# Patient Record
Sex: Male | Born: 1950
Health system: Southern US, Community
[De-identification: ages and names within clinical notes are randomized; demographics above are authoritative.]

## PROBLEM LIST (undated history)

## (undated) DIAGNOSIS — I10 Essential (primary) hypertension: Secondary | ICD-10-CM

## (undated) DIAGNOSIS — S0993XA Unspecified injury of face, initial encounter: Secondary | ICD-10-CM

## (undated) DIAGNOSIS — L409 Psoriasis, unspecified: Secondary | ICD-10-CM

## (undated) HISTORY — PX: NO PAST SURGERIES: SHX2092

## (undated) HISTORY — DX: Unspecified injury of face, initial encounter: S09.93XA

---

## 2007-12-06 ENCOUNTER — Ambulatory Visit: Payer: Self-pay | Admitting: Family Medicine

## 2007-12-06 DIAGNOSIS — I10 Essential (primary) hypertension: Secondary | ICD-10-CM

## 2007-12-11 ENCOUNTER — Encounter: Payer: Self-pay | Admitting: Family Medicine

## 2007-12-13 ENCOUNTER — Telehealth (INDEPENDENT_AMBULATORY_CARE_PROVIDER_SITE_OTHER): Payer: Self-pay | Admitting: *Deleted

## 2007-12-13 LAB — CONVERTED CEMR LAB
ALT: 26 units/L (ref 0–53)
AST: 22 units/L (ref 0–37)
Albumin: 5 g/dL (ref 3.5–5.2)
Basophils Absolute: 0.1 10*3/uL (ref 0.0–0.1)
Basophils Relative: 1 % (ref 0–1)
CO2: 27 meq/L (ref 19–32)
Calcium: 10 mg/dL (ref 8.4–10.5)
Chloride: 104 meq/L (ref 96–112)
Lymphocytes Relative: 33 % (ref 12–46)
MCHC: 33.7 g/dL (ref 30.0–36.0)
Neutro Abs: 3.8 10*3/uL (ref 1.7–7.7)
Neutrophils Relative %: 53 % (ref 43–77)
Potassium: 4.5 meq/L (ref 3.5–5.3)
RBC: 5.01 M/uL (ref 4.22–5.81)
RDW: 14.2 % (ref 11.5–15.5)
Total Protein: 7.4 g/dL (ref 6.0–8.3)

## 2008-02-27 LAB — HM COLONOSCOPY: HM Colonoscopy: NORMAL

## 2008-03-20 ENCOUNTER — Ambulatory Visit: Payer: Self-pay | Admitting: Family Medicine

## 2008-07-08 ENCOUNTER — Ambulatory Visit: Payer: Self-pay | Admitting: Family Medicine

## 2008-09-04 ENCOUNTER — Encounter: Payer: Self-pay | Admitting: Family Medicine

## 2008-09-05 LAB — CONVERTED CEMR LAB
CO2: 26 meq/L (ref 19–32)
Chloride: 105 meq/L (ref 96–112)
Glucose, Bld: 103 mg/dL — ABNORMAL HIGH (ref 70–99)
Potassium: 4.1 meq/L (ref 3.5–5.3)
Sodium: 141 meq/L (ref 135–145)

## 2008-09-09 ENCOUNTER — Ambulatory Visit: Payer: Self-pay | Admitting: Family Medicine

## 2009-01-07 ENCOUNTER — Ambulatory Visit: Payer: Self-pay | Admitting: Family Medicine

## 2009-01-10 ENCOUNTER — Encounter: Payer: Self-pay | Admitting: Family Medicine

## 2009-01-13 LAB — CONVERTED CEMR LAB
BUN: 13 mg/dL (ref 6–23)
CO2: 26 meq/L (ref 19–32)
Cholesterol: 183 mg/dL (ref 0–200)
Glucose, Bld: 106 mg/dL — ABNORMAL HIGH (ref 70–99)
Sodium: 142 meq/L (ref 135–145)
Total Bilirubin: 0.6 mg/dL (ref 0.3–1.2)
Total Protein: 7.2 g/dL (ref 6.0–8.3)
Triglycerides: 154 mg/dL — ABNORMAL HIGH (ref <150)
VLDL: 31 mg/dL (ref 0–40)

## 2009-05-30 ENCOUNTER — Ambulatory Visit: Payer: Self-pay | Admitting: Family Medicine

## 2009-05-30 DIAGNOSIS — R7301 Impaired fasting glucose: Secondary | ICD-10-CM

## 2009-09-23 ENCOUNTER — Telehealth: Payer: Self-pay | Admitting: Family Medicine

## 2009-10-11 ENCOUNTER — Encounter: Payer: Self-pay | Admitting: Family Medicine

## 2009-10-12 LAB — CONVERTED CEMR LAB
BUN: 15 mg/dL (ref 6–23)
Chloride: 106 meq/L (ref 96–112)
Glucose, Bld: 102 mg/dL — ABNORMAL HIGH (ref 70–99)
Potassium: 3.9 meq/L (ref 3.5–5.3)

## 2009-10-28 ENCOUNTER — Ambulatory Visit: Payer: Self-pay | Admitting: Family Medicine

## 2009-10-28 DIAGNOSIS — E781 Pure hyperglyceridemia: Secondary | ICD-10-CM

## 2010-04-28 NOTE — Assessment & Plan Note (Signed)
Summary: HTN, glucose   Vital Signs:  Patient profile:   60 year old male Height:      73 inches Weight:      192 pounds BMI:     25.42 Pulse rate:   86 / minute BP sitting:   154 / 93  (left arm) Cuff size:   regular  Vitals Entered By: Avon Gully CMA, Duncan Dull) (October 28, 2009 3:39 PM)  Serial Vital Signs/Assessments:  Time      Position  BP       Pulse  Resp  Temp     By 3:58 PM             142/92                         Nani Gasser MD  CC: f/u BP . Pt brought a form to be filled out for work, Hypertension Management   Primary Care Provider:  Nani Gasser MD  CC:  f/u BP . Pt brought a form to be filled out for work and Hypertension Management.  History of Present Illness: Also f/u elevated glucose.   Hypertension History:      He notes no problems with any antihypertensive medication side effects.  Home BPs have been in the 120/80s. We have measured his machine against ours and his machine is accurate.  Marland Kitchen        Positive major cardiovascular risk factors include male age 68 years old or older, hyperlipidemia, and hypertension.  Negative major cardiovascular risk factors include non-tobacco-user status.     Current Medications (verified): 1)  Diovan Hct 160-12.5 Mg  Tabs (Valsartan-Hydrochlorothiazide) .... One By Mouth Every Morning  Allergies (verified): No Known Drug Allergies  Comments:  Nurse/Medical Assistant: The patient's medications and allergies were reviewed with the patient and were updated in the Medication and Allergy Lists. Avon Gully CMA, Duncan Dull) (October 28, 2009 3:40 PM)  Physical Exam  General:  Well-developed,well-nourished,in no acute distress; alert,appropriate and cooperative throughout examination Lungs:  Normal respiratory effort, chest expands symmetrically. Lungs are clear to auscultation, no crackles or wheezes. Heart:  Normal rate and regular rhythm. S1 and S2 normal without gallop, murmur, click, rub or other  extra sounds. Pulses:  Radial 2+  Psych:  Cognition and judgment appear intact. Alert and cooperative with normal attention span and concentration. No apparent delusions, illusions, hallucinations   Impression & Recommendations:  Problem # 1:  HYPERTENSION (ICD-401.1) Home BPs ahve been excellent.  His updated medication list for this problem includes:    Diovan Hct 160-12.5 Mg Tabs (Valsartan-hydrochlorothiazide) ..... One by mouth every morning  BP today: 154/93 Prior BP: 152/89 (05/30/2009)  Prior 10 Yr Risk Heart Disease: 9 % (01/07/2009)  Labs Reviewed: K+: 3.9 (10/11/2009) Creat: : 1.00 (10/11/2009)   Chol: 183 (01/10/2009)   HDL: 38 (01/10/2009)   LDL: 114 (01/10/2009)   TG: 154 (01/10/2009)  Problem # 2:  IMPAIRED FASTING GLUCOSE (ICD-790.21) Cut back on his breads and soda. Has been exercising daily with a walking program. A1C looks great. Recheck in gluose in 6 months.   Orders: Fingerstick (36416) Hemoglobin A1C (83036)  Complete Medication List: 1)  Diovan Hct 160-12.5 Mg Tabs (Valsartan-hydrochlorothiazide) .... One by mouth every morning  Hypertension Assessment/Plan:      The patient's hypertensive risk group is category B: At least one risk factor (excluding diabetes) with no target organ damage.  His calculated 10 year risk of coronary heart  disease is 18 %.  Today's blood pressure is 154/93.    Patient Instructions: 1)  Please schedule a follow-up appointment in 6 months  for blood pressure and labs (lipids).   2)  keep up the exercise and diet.   Laboratory Results   Blood Tests   Date/Time Received: 10/28/09 Date/Time Reported: 10/28/09

## 2010-04-28 NOTE — Assessment & Plan Note (Signed)
Summary: 6 mo F/U HTN, glucose   Vital Signs:  Patient profile:   59 year old male Height:      73 inches Weight:      198 pounds Pulse rate:   76 / minute BP sitting:   152 / 89  (left arm) Cuff size:   regular  Vitals Entered By: Kathlene November (May 30, 2009 3:36 PM)  Serial Vital Signs/Assessments:  Time      Position  BP       Pulse  Resp  Temp     By 3:59 PM             148/90                         Nani Gasser MD  CC: follow-up BP   Primary Care Provider:  Nani Gasser MD  CC:  follow-up BP.  History of Present Illness: Has really been a very stressful week adn feels this is why BP is elevated. Took med this AM.  Highs at home 131/83.  On Average has been 125.  No changes in his diet. No CP, SOB, dizziness. No SE from medications. Happy with current pill.   Current Medications (verified): 1)  Diovan Hct 160-12.5 Mg  Tabs (Valsartan-Hydrochlorothiazide) .... One By Mouth Every Morning  Allergies (verified): No Known Drug Allergies  Comments:  Nurse/Medical Assistant: The patient's medications and allergies were reviewed with the patient and were updated in the Medication and Allergy Lists. Kathlene November (May 30, 2009 3:36 PM)  Physical Exam  General:  Well-developed,well-nourished,in no acute distress; alert,appropriate and cooperative throughout examination Head:  Normocephalic and atraumatic without obvious abnormalities. No apparent alopecia or balding. Lungs:  Normal respiratory effort, chest expands symmetrically. Lungs are clear to auscultation, no crackles or wheezes. Heart:  Normal rate and regular rhythm. S1 and S2 normal without gallop, murmur, click, rub or other extra sounds. No carotid bruits.  Skin:  no rashes.   Psych:  Cognition and judgment appear intact. Alert and cooperative with normal attention span and concentration. No apparent delusions, illusions, hallucinations   Impression & Recommendations:  Problem # 1:  HYPERTENSION  (ICD-401.1) Elevated today in the office but home BPs at home have been great. Will continue current regimen. F/u in the fall.   His updated medication list for this problem includes:    Diovan Hct 160-12.5 Mg Tabs (Valsartan-hydrochlorothiazide) ..... One by mouth every morning  BP today: 152/89 Prior BP: 128/74 (01/07/2009)  Prior 10 Yr Risk Heart Disease: 9 % (01/07/2009)  Labs Reviewed: K+: 4.1 (01/10/2009) Creat: : 0.87 (01/10/2009)   Chol: 183 (01/10/2009)   HDL: 38 (01/10/2009)   LDL: 114 (01/10/2009)   TG: 154 (01/10/2009)  Orders: T-Basic Metabolic Panel [phone])  Problem # 2:  IMPAIRED FASTING GLUCOSE (ICD-790.21) Due to recheck fasting since was elevated so need to recheck.   Complete Medication List: 1)  Diovan Hct 160-12.5 Mg Tabs (Valsartan-hydrochlorothiazide) .... One by mouth every morning  Other Orders: Tdap => 69yrs IM (01601) Admin 1st Vaccine (09323) Prescriptions: DIOVAN HCT 160-12.5 MG  TABS (VALSARTAN-HYDROCHLOROTHIAZIDE) one by mouth every morning  #90 x 2   Entered and Authorized by:   Nani Gasser MD   Signed by:   Nani Gasser MD on 05/30/2009   Method used:   Electronically to        [facility]* (retail)       [address]  [place], [place]  [address]       Ph: [phone]       Fax: [phone]   RxID:   [id number]   Flu Vaccine Result Date:  12/27/2008 Flu Vaccine Result:  given Flu Vaccine Next Due:  1 yr Flex Sig Next Due:  Not Indicated Hemoccult Next Due:  Not Indicated   Immunizations Administered:  Tetanus Vaccine:    Vaccine Type: Tdap    Site: left deltoid    Mfr: GlaxoSmithKline    Dose: 0.5 ml    Route: IM    Given by: Kathlene November    Exp. Date: 01/22/2010    Lot #: [id number]    VIS given: 02/14/07 version given May 30, 2009.

## 2010-04-28 NOTE — Progress Notes (Signed)
Summary: pt with a question   Phone Note Call from Patient   Caller: Patient Summary of Call: Call patient back at [phone]. He states that he left a message on your voice mail and has not heard back. He wants to know if he needs to make a appt to get his wellness form filled out for work or was him being seen here in April still sufficiant enough? Call patient back to let him know. Thanks, Michaelle Copas  September 23, 2009 11:08 AM  Initial call taken by: Michaelle Copas,  September 23, 2009 11:08 AM  Follow-up for Phone Call        Pt notified needs OV Follow-up by: Kathlene November,  September 23, 2009 12:50 PM

## 2010-06-02 ENCOUNTER — Ambulatory Visit: Payer: Self-pay | Admitting: Family Medicine

## 2010-11-09 ENCOUNTER — Encounter: Payer: Self-pay | Admitting: Family Medicine

## 2010-11-13 ENCOUNTER — Ambulatory Visit (INDEPENDENT_AMBULATORY_CARE_PROVIDER_SITE_OTHER): Payer: BC Managed Care – PPO | Admitting: Family Medicine

## 2010-11-13 ENCOUNTER — Encounter: Payer: Self-pay | Admitting: Family Medicine

## 2010-11-13 VITALS — BP 153/90 | HR 103 | Ht 73.0 in | Wt 197.0 lb

## 2010-11-13 DIAGNOSIS — I10 Essential (primary) hypertension: Secondary | ICD-10-CM

## 2010-11-13 NOTE — Progress Notes (Signed)
  Subjective:    Patient ID: Angel Flores, male    DOB: 1951/02/06, 60 y.o.   MRN: [id number]  HPI HTN - Home BPs have been 120-130/80s. He has checked his machine here and it compares appropirately. He took his medication this AM but has had a really stressful schedule. He has been traveling out of the country so hasn't been able to make his appt.  No CP or SOB. He feels really stressed today.  He just found out at work that he may have to travel to [place] again for several months    Review of Systems     Objective:   Physical Exam  Constitutional: He is oriented to person, place, and time. He appears well-developed and well-nourished.  HENT:  Head: Normocephalic and atraumatic.  Cardiovascular: Normal rate, regular rhythm and normal heart sounds.   Pulmonary/Chest: Effort normal and breath sounds normal.  Neurological: He is alert and oriented to person, place, and time.  Skin: Skin is warm and dry.  Psychiatric: He has a normal mood and affect.          Assessment & Plan:  He plns on getting his flu vac at work today.

## 2010-11-13 NOTE — Assessment & Plan Note (Signed)
Home BPs look great but it is high today.Asked him to come in next week for BP recheck and he is due for labwork.  Will recheck and call with results.

## 2010-11-13 NOTE — Patient Instructions (Signed)
Recommend BP check with the nurse sometime next week just to recheck.

## 2010-11-17 LAB — LIPID PANEL
Cholesterol: 184 mg/dL (ref 0–200)
HDL: 40 mg/dL (ref >39)
Triglycerides: 148 mg/dL (ref <150)

## 2010-11-18 ENCOUNTER — Telehealth: Payer: Self-pay | Admitting: Family Medicine

## 2010-11-18 LAB — COMPLETE METABOLIC PANEL WITH GFR
CO2: 26 mEq/L (ref 19–32)
Creat: 0.96 mg/dL (ref 0.50–1.35)
GFR, Est African American: >60 mL/min (ref >60)
GFR, Est Non African American: >60 mL/min (ref >60)
Glucose, Bld: 102 mg/dL — ABNORMAL HIGH (ref 70–99)
Total Bilirubin: 1 mg/dL (ref 0.3–1.2)

## 2010-11-18 NOTE — Telephone Encounter (Signed)
  Call pt: CMP is normal except sugar is still borderline but stable. TG look better this year. LDL is still up a little, but stable. Recheck BMP in 6months. Recheck chol in 1 yr.

## 2010-11-18 NOTE — Telephone Encounter (Signed)
LMOM with results

## 2010-11-23 ENCOUNTER — Telehealth: Payer: Self-pay | Admitting: Family Medicine

## 2010-11-23 NOTE — Telephone Encounter (Signed)
We will fax today. Sue Lush will call before she faxes it since he has a shared fax machine.

## 2010-11-23 NOTE — Telephone Encounter (Signed)
Pt called and said he needed a reminder sent to the provider that he needs to get the papers he dropped off on 11-13-10 faxed to him no later than Wednesday of this week which will be 11-25-10.  He has to turn in to his employer no later than end of this week.  Please advise.  The papers should of had a fax # that they are to be faxed to which will be going to the pt fax #. Plan:   Routed to Dr. Marlyne Beards, LPN Domingo Dimes

## 2010-11-23 NOTE — Telephone Encounter (Signed)
Notified the pt that his form will be faxed to him today.  Pt states he got the form faxed to him prior to lunch, but he needs it re-faxed with the office stamp faxed with it on the form. Plan:  Routed to [facility], CMA Jarvis Newcomer, LPN Domingo Dimes

## 2010-11-25 NOTE — Telephone Encounter (Signed)
Confirmed with Sue Lush, CMA that she had re-faxed the form with the office stamp on the form. Jarvis Newcomer, LPN Domingo Dimes

## 2011-05-26 ENCOUNTER — Other Ambulatory Visit: Payer: Self-pay | Admitting: Family Medicine

## 2011-05-27 NOTE — Telephone Encounter (Signed)
Needs appointment

## 2011-08-26 ENCOUNTER — Encounter: Payer: Self-pay | Admitting: *Deleted

## 2011-08-26 ENCOUNTER — Other Ambulatory Visit: Payer: Self-pay | Admitting: Family Medicine

## 2011-09-02 ENCOUNTER — Encounter: Payer: Self-pay | Admitting: Family Medicine

## 2011-09-02 ENCOUNTER — Ambulatory Visit (INDEPENDENT_AMBULATORY_CARE_PROVIDER_SITE_OTHER): Payer: BC Managed Care – PPO | Admitting: Family Medicine

## 2011-09-02 VITALS — BP 149/98 | HR 85 | Ht 73.0 in | Wt 194.0 lb

## 2011-09-02 DIAGNOSIS — I10 Essential (primary) hypertension: Secondary | ICD-10-CM

## 2011-09-02 MED ORDER — VALSARTAN-HYDROCHLOROTHIAZIDE 160-12.5 MG PO TABS: 1.0000 | ORAL_TABLET | Freq: Every day | ORAL | Status: DC

## 2011-09-02 NOTE — Progress Notes (Signed)
  Subjective:    Patient ID: Angel Flores, male    DOB: Mar 11, 1951, 61 y.o.   MRN: [id number]  HPI HTN- no CP or SOB.  Taking meds regularly. BPs have mostly been in the 130s at home.  Says does have white coat hypertension.    Psoriais flared while in [place].    Review of Systems     Objective:   Physical Exam  Constitutional: He is oriented to person, place, and time. He appears well-developed and well-nourished.  HENT:  Head: Normocephalic and atraumatic.  Cardiovascular: Normal rate, regular rhythm and normal heart sounds.   Pulmonary/Chest: Effort normal and breath sounds normal.  Musculoskeletal: He exhibits no edema.  Neurological: He is alert and oriented to person, place, and time.  Skin: Skin is warm and dry.  Psychiatric: He has a normal mood and affect. His behavior is normal.          Assessment & Plan:  HTN - BPs well controlled at home.  Due for CMP and lipids. He doe have white coat HTN. Since home BPs were well controlled will f/u in 6 months. We have verified his home BP machine and it is accurate.

## 2011-09-02 NOTE — Patient Instructions (Signed)
We will call you with your lab results. If you don't here from us in about a week then please give us a call at [phone].  

## 2011-09-08 LAB — COMPLETE METABOLIC PANEL WITH GFR
Albumin: 4.6 g/dL (ref 3.5–5.2)
CO2: 25 mEq/L (ref 19–32)
Calcium: 9.7 mg/dL (ref 8.4–10.5)
GFR, Est African American: >89 mL/min
GFR, Est Non African American: >89 mL/min
Glucose, Bld: 100 mg/dL — ABNORMAL HIGH (ref 70–99)
Sodium: 141 mEq/L (ref 135–145)
Total Bilirubin: 0.9 mg/dL (ref 0.3–1.2)
Total Protein: 7.1 g/dL (ref 6.0–8.3)

## 2011-09-08 LAB — LIPID PANEL: HDL: 44 mg/dL (ref >39)

## 2011-09-09 ENCOUNTER — Encounter: Payer: Self-pay | Admitting: Family Medicine

## 2012-06-16 ENCOUNTER — Other Ambulatory Visit: Payer: Self-pay | Admitting: Family Medicine

## 2012-07-15 ENCOUNTER — Other Ambulatory Visit: Payer: Self-pay | Admitting: Family Medicine

## 2012-07-19 ENCOUNTER — Other Ambulatory Visit: Payer: Self-pay | Admitting: *Deleted

## 2012-07-19 MED ORDER — VALSARTAN-HYDROCHLOROTHIAZIDE 160-12.5 MG PO TABS: ORAL_TABLET | ORAL | Status: DC

## 2012-08-07 ENCOUNTER — Ambulatory Visit (INDEPENDENT_AMBULATORY_CARE_PROVIDER_SITE_OTHER): Payer: BC Managed Care – PPO | Admitting: Family Medicine

## 2012-08-07 ENCOUNTER — Encounter: Payer: Self-pay | Admitting: Family Medicine

## 2012-08-07 VITALS — BP 174/93 | HR 73 | Ht 73.0 in | Wt 196.0 lb

## 2012-08-07 DIAGNOSIS — R7301 Impaired fasting glucose: Secondary | ICD-10-CM

## 2012-08-07 DIAGNOSIS — I10 Essential (primary) hypertension: Secondary | ICD-10-CM

## 2012-08-07 LAB — POCT GLYCOSYLATED HEMOGLOBIN (HGB A1C): Hemoglobin A1C: 5.4

## 2012-08-07 MED ORDER — VALSARTAN-HYDROCHLOROTHIAZIDE 160-12.5 MG PO TABS: ORAL_TABLET | ORAL | Status: DC

## 2012-08-07 NOTE — Progress Notes (Signed)
  Subjective:    Patient ID: Angel Flores, male    DOB: Nov 03, 1950, 62 y.o.   MRN: [id number]  HPI HTN- Home BPs running 120-low to mid 80s.  Pt denies chest pain, SOB, dizziness, or heart palpitations.  Taking meds as directed w/o problems.  Denies medication side effects.  BP was 122/81 today.  Trying to stay active.      Review of Systems     Objective:   Physical Exam  Constitutional: He is oriented to person, place, and time. He appears well-developed and well-nourished.  HENT:  Head: Normocephalic and atraumatic.  Cardiovascular: Normal rate, regular rhythm and normal heart sounds.   Pulmonary/Chest: Effort normal and breath sounds normal.  Neurological: He is alert and oriented to person, place, and time.  Skin: Skin is warm and dry.  Psychiatric: He has a normal mood and affect. His behavior is normal.          Assessment & Plan:  HTN - WEll controlled at home.  F/U in 6 months. Due for CMP and Lipids in 6 months.   Hyperlipidemia - Reviewed results from last June.  He would like to have it rechecked.   Lab Results  Component Value Date   CHOL 190 09/08/2011   HDL 44 09/08/2011   LDLCALC 116* 09/08/2011   TRIG 152* 09/08/2011   CHOLHDL 4.3 09/08/2011   Abnormal glucose-hemoglobin A1c is 5.4 today which is fantastic and reassuring that he is not diabetic or insulin resistant.

## 2012-08-08 LAB — COMPLETE METABOLIC PANEL WITH GFR
ALT: 29 U/L (ref 0–53)
Albumin: 4.4 g/dL (ref 3.5–5.2)
CO2: 27 mEq/L (ref 19–32)
Calcium: 9.4 mg/dL (ref 8.4–10.5)
Chloride: 106 mEq/L (ref 96–112)
GFR, Est African American: >89 mL/min
Potassium: 4.1 mEq/L (ref 3.5–5.3)
Sodium: 142 mEq/L (ref 135–145)
Total Protein: 6.5 g/dL (ref 6.0–8.3)

## 2012-08-08 LAB — LIPID PANEL: LDL Cholesterol: 107 mg/dL — ABNORMAL HIGH (ref 0–99)

## 2012-08-14 ENCOUNTER — Telehealth: Payer: Self-pay | Admitting: *Deleted

## 2012-08-14 NOTE — Telephone Encounter (Signed)
Pt calls today & wants to know if you have filled out the health questionaire/form that he left with you last week?  And if so, can you go ahead & fax it to the number he left you.  Thanks

## 2012-08-14 NOTE — Telephone Encounter (Signed)
Will send to [facility]

## 2012-08-15 NOTE — Telephone Encounter (Signed)
Called pt and informed him that his form has been faxed.Loralee Pacas [place]

## 2012-08-26 ENCOUNTER — Emergency Department (INDEPENDENT_AMBULATORY_CARE_PROVIDER_SITE_OTHER): Payer: BC Managed Care – PPO

## 2012-08-26 ENCOUNTER — Emergency Department
Admission: EM | Admit: 2012-08-26 | Discharge: 2012-08-26 | Disposition: A | Discharge status: Home / Self Care | Payer: BC Managed Care – PPO | Source: Home / Self Care | Attending: Family Medicine | Admitting: Family Medicine

## 2012-08-26 ENCOUNTER — Encounter: Payer: Self-pay | Admitting: *Deleted

## 2012-08-26 DIAGNOSIS — M7020 Olecranon bursitis, unspecified elbow: Secondary | ICD-10-CM

## 2012-08-26 DIAGNOSIS — A499 Bacterial infection, unspecified: Secondary | ICD-10-CM

## 2012-08-26 DIAGNOSIS — M25429 Effusion, unspecified elbow: Secondary | ICD-10-CM

## 2012-08-26 DIAGNOSIS — M71122 Other infective bursitis, left elbow: Secondary | ICD-10-CM

## 2012-08-26 HISTORY — DX: Psoriasis, unspecified: L40.9

## 2012-08-26 HISTORY — DX: Essential (primary) hypertension: I10

## 2012-08-26 LAB — POCT CBC W AUTO DIFF (K'VILLE URGENT CARE)

## 2012-08-26 MED ORDER — SULFAMETHOXAZOLE-TRIMETHOPRIM 800-160 MG PO TABS: 1.0000 | ORAL_TABLET | Freq: Two times a day (BID) | ORAL | Status: DC

## 2012-08-26 MED ORDER — CEFTRIAXONE SODIUM 1 G IJ SOLR
1.0000 g | Freq: Once | INTRAMUSCULAR | Status: AC
  Administered 2012-08-26: 1 g via INTRAMUSCULAR
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
## 2012-08-26 NOTE — ED Notes (Signed)
Patient c/o bump, swelling and pain on left elbow x 1 day.

## 2012-08-27 LAB — BODY FLUID CELL COUNT WITH DIFFERENTIAL
Eos, Fluid: 0 %
Lymphs, Fluid: 5 %
Monocyte-Macrophage-Serous Fluid: 5 %
Neutrophil Count, Fluid: 90 % — ABNORMAL HIGH (ref 0–25)

## 2012-08-28 ENCOUNTER — Ambulatory Visit (INDEPENDENT_AMBULATORY_CARE_PROVIDER_SITE_OTHER): Payer: BC Managed Care – PPO | Admitting: Sports Medicine

## 2012-08-28 ENCOUNTER — Encounter: Payer: Self-pay | Admitting: Sports Medicine

## 2012-08-28 VITALS — BP 156/92 | HR 89 | Wt 195.0 lb

## 2012-08-28 DIAGNOSIS — B9689 Other specified bacterial agents as the cause of diseases classified elsewhere: Secondary | ICD-10-CM

## 2012-08-28 DIAGNOSIS — M7020 Olecranon bursitis, unspecified elbow: Secondary | ICD-10-CM

## 2012-08-28 DIAGNOSIS — A499 Bacterial infection, unspecified: Secondary | ICD-10-CM

## 2012-08-28 DIAGNOSIS — M71122 Other infective bursitis, left elbow: Secondary | ICD-10-CM | POA: Insufficient documentation

## 2012-08-28 NOTE — Progress Notes (Signed)
   Subjective:    I'm seeing this patient as a consultation for:  Dr. Cathren Harsh  CC: Olecranon bursitis  HPI: This very pleasant 61 year old male has a two-day history of increasing pain, swelling, redness over his left elbow after bumping against something. He was seen in urgent care 2 days ago, 3 cc of cloudy fluid was aspirated, sent for culture, and he was started on Septra. Overall he reports the pain and swelling over his elbow has improved, he does have some increasing swelling somewhat distal down his forearm. Pain is only minimal, localized, without radiation. Denies any fevers or chills.  Past medical history, Surgical history, Family history not pertinant except as noted below, Social history, Allergies, and medications have been entered into the medical record, reviewed, and no changes needed.   Review of Systems: No headache, visual changes, nausea, vomiting, diarrhea, constipation, dizziness, abdominal pain, skin rash, fevers, chills, night sweats, weight loss, swollen lymph nodes, body aches, joint swelling, muscle aches, chest pain, shortness of breath, mood changes, visual or auditory hallucinations.   Objective:   General: Well Developed, well nourished, and in no acute distress.  Neuro/Psych: Alert and oriented x3, extra-ocular muscles intact, able to move all 4 extremities, sensation grossly intact. Skin: Warm and dry, no rashes noted.  Respiratory: Not using accessory muscles, speaking in full sentences, trachea midline.  Cardiovascular: Pulses palpable, no extremity edema. Abdomen: Does not appear distended. Left Elbow: Fullness visible as well as tenderness to palpation over her olecranon bursa, there is mild erythema visible. Range of motion full pronation, supination, flexion, extension. Strength is full to all of the above directions Stable to varus, valgus stress. Negative moving valgus stress test. No discrete areas of tenderness to palpation. Ulnar nerve does not  sublux. Negative cubital tunnel Tinel's.  Impression and Recommendations:   This case required medical decision making of moderate complexity.

## 2012-08-28 NOTE — Assessment & Plan Note (Signed)
Looks to be improved significantly since aspiration by Dr. Cathren Harsh 48 hours ago. Continue wrap, it was wrapped with compressive bandage. Continue Septra, he has an additional 8 days. Return in one week, and if no better certainly we can perform an incision and drainage, but overall it is improving significantly.

## 2012-08-29 LAB — CBC WITH DIFFERENTIAL/PLATELET
Basophils Absolute: 0 10*3/uL (ref 0.0–0.1)
Basophils Relative: 0 % (ref 0–1)
Eosinophils Absolute: 0.1 K/uL (ref 0.0–0.7)
Eosinophils Relative: 1 % (ref 0–5)
HCT: 39.3 % (ref 39.0–52.0)
Hemoglobin: 13.6 g/dL (ref 13.0–17.0)
Lymphocytes Relative: 18 % (ref 12–46)
Lymphs Abs: 1.8 K/uL (ref 0.7–4.0)
MCH: 31.3 pg (ref 26.0–34.0)
MCHC: 34.6 g/dL (ref 30.0–36.0)
MCV: 90.3 fL (ref 78.0–100.0)
Monocytes Absolute: 1.2 10*3/uL — ABNORMAL HIGH (ref 0.1–1.0)
Monocytes Relative: 12 % (ref 3–12)
Neutro Abs: 6.5 10*3/uL (ref 1.7–7.7)
Neutrophils Relative %: 69 % (ref 43–77)
Platelets: 224 10*3/uL (ref 150–400)
RBC: 4.35 MIL/uL (ref 4.22–5.81)
RDW: 13.8 % (ref 11.5–15.5)
WBC: 9.5 K/uL (ref 4.0–10.5)

## 2012-09-04 ENCOUNTER — Encounter: Payer: Self-pay | Admitting: Sports Medicine

## 2012-09-04 ENCOUNTER — Ambulatory Visit (INDEPENDENT_AMBULATORY_CARE_PROVIDER_SITE_OTHER): Payer: BC Managed Care – PPO | Admitting: Sports Medicine

## 2012-09-04 VITALS — BP 139/87 | HR 81 | Wt 192.0 lb

## 2012-09-04 DIAGNOSIS — B9689 Other specified bacterial agents as the cause of diseases classified elsewhere: Secondary | ICD-10-CM

## 2012-09-04 DIAGNOSIS — M71122 Other infective bursitis, left elbow: Secondary | ICD-10-CM

## 2012-09-04 DIAGNOSIS — A499 Bacterial infection, unspecified: Secondary | ICD-10-CM

## 2012-09-04 DIAGNOSIS — M7020 Olecranon bursitis, unspecified elbow: Secondary | ICD-10-CM

## 2012-09-04 MED ORDER — DOXYCYCLINE HYCLATE 100 MG PO TABS: 100.0000 mg | ORAL_TABLET | Freq: Two times a day (BID) | ORAL | Status: AC

## 2012-09-04 NOTE — Progress Notes (Signed)
   Subjective:    CC: Olecranon bursitis  HPI: This pleasant 62 year old male comes back for followup of his olecranon bursitis, septic. End up growing out Staphylococcus aureus. He has finished 7 days of Septra, and notes the pain is essentially completely resolved, swelling is localized, any pain that is there does not radiate, mild, sharp. Worse with palpation. No fevers, chills, or other constitutional symptoms.  Past medical history, Surgical history, Family history not pertinant except as noted below, Social history, Allergies, and medications have been entered into the medical record, reviewed, and no changes needed.   Review of Systems: No headache, visual changes, nausea, vomiting, diarrhea, constipation, dizziness, abdominal pain, skin rash, fevers, chills, night sweats, weight loss, swollen lymph nodes, body aches, joint swelling, muscle aches, chest pain, shortness of breath, mood changes, visual or auditory hallucinations.   Objective:   General: Well Developed, well nourished, and in no acute distress.  Neuro/Psych: Alert and oriented x3, extra-ocular muscles intact, able to move all 4 extremities, sensation grossly intact. Skin: Warm and dry, no rashes noted.  Respiratory: Not using accessory muscles, speaking in full sentences, trachea midline.  Cardiovascular: Pulses palpable, no extremity edema. Abdomen: Does not appear distended. Left Elbow: Fullness visible as well as tenderness to palpation over her olecranon bursa, there is no longer erythema, and this is improved significantly since the last visit. Range of motion full pronation, supination, flexion, extension. Strength is full to all of the above directions Stable to varus, valgus stress. Negative moving valgus stress test. No discrete areas of tenderness to palpation. Ulnar nerve does not sublux. Negative cubital tunnel Tinel's.  Impression and Recommendations:   This case required medical decision making of  moderate complexity.

## 2012-09-04 NOTE — Assessment & Plan Note (Signed)
Doing extremely well, painless. I'm going to do 5 more days of doxycycline. Strapped the elbow again. Return in one month.

## 2012-09-05 ENCOUNTER — Ambulatory Visit: Payer: BC Managed Care – PPO | Admitting: Sports Medicine

## 2012-10-02 ENCOUNTER — Encounter: Payer: Self-pay | Admitting: Sports Medicine

## 2012-10-02 ENCOUNTER — Ambulatory Visit (INDEPENDENT_AMBULATORY_CARE_PROVIDER_SITE_OTHER): Payer: BC Managed Care – PPO | Admitting: Sports Medicine

## 2012-10-02 VITALS — BP 136/88 | HR 79 | Temp 98.4°F | Wt 190.0 lb

## 2012-10-02 DIAGNOSIS — A499 Bacterial infection, unspecified: Secondary | ICD-10-CM

## 2012-10-02 DIAGNOSIS — M71122 Other infective bursitis, left elbow: Secondary | ICD-10-CM

## 2012-10-02 DIAGNOSIS — B9689 Other specified bacterial agents as the cause of diseases classified elsewhere: Secondary | ICD-10-CM

## 2012-10-02 DIAGNOSIS — M7020 Olecranon bursitis, unspecified elbow: Secondary | ICD-10-CM

## 2012-10-02 NOTE — Assessment & Plan Note (Signed)
Resolved with 2 courses of antibiotics, return on an as-needed basis.

## 2012-10-02 NOTE — Progress Notes (Signed)
  Subjective:    CC: Followup  HPI: Left septic olecranon bursitis: Resolved after 2 courses of antibiotics. Pain free.  Past medical history, Surgical history, Family history not pertinant except as noted below, Social history, Allergies, and medications have been entered into the medical record, reviewed, and no changes needed.   Review of Systems: No fevers, chills, night sweats, weight loss, chest pain, or shortness of breath.   Objective:    General: Well Developed, well nourished, and in no acute distress.  Neuro: Alert and oriented x3, extra-ocular muscles intact, sensation grossly intact.  HEENT: Normocephalic, atraumatic, pupils equal round reactive to light, neck supple, no masses, no lymphadenopathy, thyroid nonpalpable.  Skin: Warm and dry, no rashes. Cardiac: Regular rate and rhythm, no murmurs rubs or gallops, no lower extremity edema.  Respiratory: Clear to auscultation bilaterally. Not using accessory muscles, speaking in full sentences. Left Elbow: Unremarkable to inspection. Range of motion full pronation, supination, flexion, extension. Strength is full to all of the above directions Stable to varus, valgus stress. Negative moving valgus stress test. No discrete areas of tenderness to palpation. Ulnar nerve does not sublux. Negative cubital tunnel Tinel's.  Impression and Recommendations:

## 2013-05-14 ENCOUNTER — Other Ambulatory Visit: Payer: Self-pay | Admitting: Family Medicine

## 2013-06-12 ENCOUNTER — Encounter: Payer: Self-pay | Admitting: Family Medicine

## 2013-06-12 ENCOUNTER — Ambulatory Visit (INDEPENDENT_AMBULATORY_CARE_PROVIDER_SITE_OTHER): Payer: BC Managed Care – PPO | Admitting: Family Medicine

## 2013-06-12 VITALS — BP 154/88 | HR 78 | Wt 194.0 lb

## 2013-06-12 DIAGNOSIS — I10 Essential (primary) hypertension: Secondary | ICD-10-CM

## 2013-06-12 MED ORDER — VALSARTAN-HYDROCHLOROTHIAZIDE 160-12.5 MG PO TABS: ORAL_TABLET | ORAL | Status: DC

## 2013-06-12 NOTE — Progress Notes (Signed)
   Subjective:    Patient ID: FENIX RORKE, male    DOB: 05/22/1950, 63 y.o.   MRN: [id number]  HPI Hypertension- Pt denies chest pain, SOB, dizziness, or heart palpitations.  Taking meds as directed w/o problems.  Denies medication side effects.    . Home blood pressures have been running primarily in the 120s over 70s.   Review of Systems     Objective:   Physical Exam  Constitutional: He is oriented to person, place, and time. He appears well-developed and well-nourished.  HENT:  Head: Normocephalic and atraumatic.  Cardiovascular: Normal rate, regular rhythm and normal heart sounds.   Pulmonary/Chest: Effort normal and breath sounds normal.  Neurological: He is alert and oriented to person, place, and time.  Skin: Skin is warm and dry.  Psychiatric: He has a normal mood and affect. His behavior is normal.          Assessment & Plan:  Hypertension-slightly elevated today but well controlled at home. He was a little stressed today. We'll continue current regimen. Followup in 6 months. Due for CMP and fasting lipid panel in May. I want heparin lab slip today. I did add a hemoglobin A1c since he does have a history of impaired fasting glucose just to check this yearly.  Also encouraged him to think about additional vaccine. Handout provided.

## 2013-06-15 LAB — COMPLETE METABOLIC PANEL WITH GFR
ALT: 25 U/L (ref 0–53)
AST: 23 U/L (ref 0–37)
Albumin: 4.5 g/dL (ref 3.5–5.2)
Alkaline Phosphatase: 70 U/L (ref 39–117)
BILIRUBIN TOTAL: 1 mg/dL (ref 0.2–1.2)
BUN: 13 mg/dL (ref 6–23)
CALCIUM: 9.6 mg/dL (ref 8.4–10.5)
CHLORIDE: 104 meq/L (ref 96–112)
CO2: 28 meq/L (ref 19–32)
CREATININE: 0.98 mg/dL (ref 0.50–1.35)
GFR, EST NON AFRICAN AMERICAN: 83 mL/min
GLUCOSE: 101 mg/dL — AB (ref 70–99)
Potassium: 3.9 mEq/L (ref 3.5–5.3)
SODIUM: 142 meq/L (ref 135–145)
TOTAL PROTEIN: 7 g/dL (ref 6.0–8.3)

## 2013-06-15 LAB — LIPID PANEL
Cholesterol: 165 mg/dL (ref 0–200)
HDL: 39 mg/dL — AB (ref >39)
LDL CALC: 100 mg/dL — AB (ref 0–99)
Total CHOL/HDL Ratio: 4.2 Ratio
Triglycerides: 131 mg/dL (ref <150)
VLDL: 26 mg/dL (ref 0–40)

## 2013-06-15 LAB — HEMOGLOBIN A1C
HEMOGLOBIN A1C: 5.7 % — AB (ref <5.7)
Mean Plasma Glucose: 117 mg/dL — ABNORMAL HIGH (ref <117)

## 2013-09-03 ENCOUNTER — Telehealth: Payer: Self-pay | Admitting: *Deleted

## 2013-09-03 NOTE — Telephone Encounter (Signed)
lvm informing pt that form is complete and up front for p/u.Angel Flores

## 2014-06-27 ENCOUNTER — Other Ambulatory Visit: Payer: Self-pay | Admitting: Family Medicine

## 2014-08-04 ENCOUNTER — Other Ambulatory Visit: Payer: Self-pay | Admitting: Family Medicine

## 2014-08-12 ENCOUNTER — Telehealth: Payer: Self-pay | Admitting: Family Medicine

## 2014-08-12 ENCOUNTER — Other Ambulatory Visit: Payer: Self-pay | Admitting: *Deleted

## 2014-08-12 MED ORDER — VALSARTAN-HYDROCHLOROTHIAZIDE 160-12.5 MG PO TABS: 1.0000 | ORAL_TABLET | Freq: Every day | ORAL | Status: DC

## 2014-08-12 NOTE — Telephone Encounter (Signed)
Patient has an appt with you next week but is out of his medication Diovan HCT 160-12.5 mg. Please send to [facility] in [place].  thanks

## 2014-08-12 NOTE — Telephone Encounter (Signed)
Done

## 2014-08-20 ENCOUNTER — Ambulatory Visit (INDEPENDENT_AMBULATORY_CARE_PROVIDER_SITE_OTHER): Payer: BLUE CROSS/BLUE SHIELD | Admitting: Family Medicine

## 2014-08-20 ENCOUNTER — Encounter: Payer: Self-pay | Admitting: Family Medicine

## 2014-08-20 VITALS — BP 178/94 | HR 68 | Wt 199.0 lb

## 2014-08-20 DIAGNOSIS — Z114 Encounter for screening for human immunodeficiency virus [HIV]: Secondary | ICD-10-CM | POA: Diagnosis not present

## 2014-08-20 DIAGNOSIS — Z6379 Other stressful life events affecting family and household: Secondary | ICD-10-CM | POA: Diagnosis not present

## 2014-08-20 DIAGNOSIS — I10 Essential (primary) hypertension: Secondary | ICD-10-CM | POA: Diagnosis not present

## 2014-08-20 DIAGNOSIS — Z1159 Encounter for screening for other viral diseases: Secondary | ICD-10-CM | POA: Diagnosis not present

## 2014-08-20 MED ORDER — VALSARTAN-HYDROCHLOROTHIAZIDE 320-12.5 MG PO TABS: 1.0000 | ORAL_TABLET | Freq: Every day | ORAL | Status: DC

## 2014-08-20 NOTE — Progress Notes (Signed)
   Subjective:    Patient ID: Angel Flores, male    DOB: 03-01-1951, 64 y.o.   MRN: [id number]  HPI Hypertension- last seen in our office a year ago. Last labs in year ago as well. Pt denies chest pain, SOB, dizziness, or heart palpitations.  Taking meds as directed w/o problems.  Denies medication side effects.  Checks his BP at home but didn't bring in log. Has seen some BPs in the 140 recently.  Has quit exercising.   Has been fatigued for 9 months. Mother dx with dementia. He is under a lot of stress. Sleeping well overall. Had to change jobs at work to be in town more often for his mother. She is requiring 24 hour care.    Review of Systems     Objective:   Physical Exam  Constitutional: He is oriented to person, place, and time. He appears well-developed and well-nourished.  HENT:  Head: Normocephalic and atraumatic.  Cardiovascular: Normal rate, regular rhythm and normal heart sounds.   Pulmonary/Chest: Effort normal and breath sounds normal.  Neurological: He is alert and oriented to person, place, and time.  Skin: Skin is warm and dry.  Psychiatric: He has a normal mood and affect. His behavior is normal.          Assessment & Plan:  HTN -  Encourage him to bring in home BP log.  Will increase the Diovan component of his blood pressure medication. Follow-up in 6 months.  Stressed - Offered to refer him to counseling if he needs at any point.he thanked me for the offer but is not interested at this time.    he also has a form for work that needs to be completed for work, [employer] .

## 2014-08-24 LAB — LIPID PANEL
CHOLESTEROL: 178 mg/dL (ref 0–200)
HDL: 45 mg/dL (ref >=40)
LDL Cholesterol: 109 mg/dL — ABNORMAL HIGH (ref 0–99)
TRIGLYCERIDES: 119 mg/dL (ref <150)
Total CHOL/HDL Ratio: 4 Ratio
VLDL: 24 mg/dL (ref 0–40)

## 2014-08-24 LAB — COMPLETE METABOLIC PANEL WITH GFR
ALT: 30 U/L (ref 0–53)
AST: 28 U/L (ref 0–37)
Albumin: 4.8 g/dL (ref 3.5–5.2)
Alkaline Phosphatase: 68 U/L (ref 39–117)
BILIRUBIN TOTAL: 1 mg/dL (ref 0.2–1.2)
BUN: 12 mg/dL (ref 6–23)
CALCIUM: 9.6 mg/dL (ref 8.4–10.5)
CO2: 25 mEq/L (ref 19–32)
CREATININE: 0.83 mg/dL (ref 0.50–1.35)
Chloride: 105 mEq/L (ref 96–112)
GFR, Est Non African American: >89 mL/min
Glucose, Bld: 100 mg/dL — ABNORMAL HIGH (ref 70–99)
POTASSIUM: 4.1 meq/L (ref 3.5–5.3)
Sodium: 141 mEq/L (ref 135–145)
TOTAL PROTEIN: 7 g/dL (ref 6.0–8.3)

## 2014-08-24 LAB — HIV ANTIBODY (ROUTINE TESTING W REFLEX): HIV 1&2 Ab, 4th Generation: NONREACTIVE

## 2014-08-24 LAB — HEPATITIS C ANTIBODY: HCV Ab: NEGATIVE

## 2015-02-22 ENCOUNTER — Other Ambulatory Visit: Payer: Self-pay | Admitting: Family Medicine

## 2015-03-24 ENCOUNTER — Other Ambulatory Visit: Payer: Self-pay | Admitting: Family Medicine

## 2015-03-27 ENCOUNTER — Other Ambulatory Visit: Payer: Self-pay | Admitting: Family Medicine

## 2015-04-23 ENCOUNTER — Other Ambulatory Visit: Payer: Self-pay | Admitting: Family Medicine

## 2015-04-24 ENCOUNTER — Encounter: Payer: Self-pay | Admitting: Family Medicine

## 2015-04-24 ENCOUNTER — Ambulatory Visit (INDEPENDENT_AMBULATORY_CARE_PROVIDER_SITE_OTHER): Payer: BLUE CROSS/BLUE SHIELD | Admitting: Family Medicine

## 2015-04-24 VITALS — BP 156/89 | HR 68 | Ht 75.0 in | Wt 197.0 lb

## 2015-04-24 DIAGNOSIS — I10 Essential (primary) hypertension: Secondary | ICD-10-CM

## 2015-04-24 DIAGNOSIS — Z23 Encounter for immunization: Secondary | ICD-10-CM

## 2015-04-24 MED ORDER — VALSARTAN-HYDROCHLOROTHIAZIDE 320-12.5 MG PO TABS: 1.0000 | ORAL_TABLET | Freq: Every day | ORAL | Status: DC
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
## 2015-04-25 LAB — BASIC METABOLIC PANEL WITH GFR
BUN: 13 mg/dL (ref 7–25)
CALCIUM: 9.4 mg/dL (ref 8.6–10.3)
CO2: 29 mmol/L (ref 20–31)
Chloride: 103 mmol/L (ref 98–110)
Creat: 0.82 mg/dL (ref 0.70–1.25)
GLUCOSE: 77 mg/dL (ref 65–99)
POTASSIUM: 4 mmol/L (ref 3.5–5.3)
SODIUM: 142 mmol/L (ref 135–146)

## 2015-04-25 NOTE — Progress Notes (Signed)
Quick Note:  All labs are normal. ______ 

## 2015-10-01 ENCOUNTER — Ambulatory Visit (INDEPENDENT_AMBULATORY_CARE_PROVIDER_SITE_OTHER): Payer: BLUE CROSS/BLUE SHIELD | Admitting: Family Medicine

## 2015-10-01 ENCOUNTER — Encounter: Payer: Self-pay | Admitting: Family Medicine

## 2015-10-01 VITALS — BP 138/80 | HR 70 | Ht 75.0 in | Wt 196.0 lb

## 2015-10-01 DIAGNOSIS — I10 Essential (primary) hypertension: Secondary | ICD-10-CM

## 2015-10-01 DIAGNOSIS — E781 Pure hyperglyceridemia: Secondary | ICD-10-CM

## 2015-10-01 DIAGNOSIS — Z125 Encounter for screening for malignant neoplasm of prostate: Secondary | ICD-10-CM

## 2015-10-01 DIAGNOSIS — R7301 Impaired fasting glucose: Secondary | ICD-10-CM

## 2015-10-01 LAB — LIPID PANEL
CHOL/HDL RATIO: 3.4 ratio (ref <=5.0)
Cholesterol: 165 mg/dL (ref 125–200)
HDL: 49 mg/dL (ref >=40)
LDL CALC: 99 mg/dL (ref <130)
Triglycerides: 84 mg/dL (ref <150)
VLDL: 17 mg/dL (ref <30)

## 2015-10-01 LAB — COMPLETE METABOLIC PANEL WITH GFR
ALT: 26 U/L (ref 9–46)
AST: 28 U/L (ref 10–35)
Albumin: 4.7 g/dL (ref 3.6–5.1)
Alkaline Phosphatase: 65 U/L (ref 40–115)
BUN: 16 mg/dL (ref 7–25)
CO2: 28 mmol/L (ref 20–31)
Calcium: 9.6 mg/dL (ref 8.6–10.3)
Chloride: 105 mmol/L (ref 98–110)
Creat: 0.97 mg/dL (ref 0.70–1.25)
GFR, EST NON AFRICAN AMERICAN: 83 mL/min (ref >=60)
GFR, Est African American: >89 mL/min (ref >=60)
GLUCOSE: 89 mg/dL (ref 65–99)
POTASSIUM: 4.1 mmol/L (ref 3.5–5.3)
SODIUM: 141 mmol/L (ref 135–146)
TOTAL PROTEIN: 6.9 g/dL (ref 6.1–8.1)
Total Bilirubin: 0.9 mg/dL (ref 0.2–1.2)

## 2015-10-01 LAB — POCT GLYCOSYLATED HEMOGLOBIN (HGB A1C): HEMOGLOBIN A1C: 5.1

## 2015-10-01 MED ORDER — VALSARTAN-HYDROCHLOROTHIAZIDE 320-12.5 MG PO TABS: 1.0000 | ORAL_TABLET | Freq: Every day | ORAL | Status: DC

## 2015-10-01 NOTE — Progress Notes (Signed)
Subjective:    CC:   HPI: Hypertension- Pt denies chest pain, SOB, dizziness, or heart palpitations.  Taking meds as directed w/o problems.  Denies medication side effects.    IFG - No increased thirst or urination. Walking daily on his lunch break for 20-30 min   Past medical history, Surgical history, Family history not pertinant except as noted below, Social history, Allergies, and medications have been entered into the medical record, reviewed, and corrections made.   Review of Systems: No fevers, chills, night sweats, weight loss, chest pain, or shortness of breath.   Objective:    General: Well Developed, well nourished, and in no acute distress.  Neuro: Alert and oriented x3, extra-ocular muscles intact, sensation grossly intact.  HEENT: Normocephalic, atraumatic  Skin: Warm and dry, no rashes. Cardiac: Regular rate and rhythm, no murmurs rubs or gallops, no lower extremity edema.  Respiratory: Clear to auscultation bilaterally. Not using accessory muscles, speaking in full sentences.   Impression and Recommendations:    Hypertension- Well controlled. Continue current regimen. Follow up in 6 months. Keep up the exercise.   Impaired fasting glucose-hemoglobin A1c of 5.1 today which is fantastic. This is down from previous of 5.7. Great work. Recheck in one year.

## 2015-10-02 ENCOUNTER — Telehealth: Payer: Self-pay | Admitting: *Deleted

## 2015-10-02 LAB — PSA: PSA: 0.79 ng/mL (ref <=4.00)

## 2015-10-02 NOTE — Progress Notes (Signed)
Quick Note:  All labs are normal. ______ 

## 2015-10-02 NOTE — Telephone Encounter (Signed)
Form completed. Faxed, confirmation received, scanned. Pt informed and original placed up front for p/u.Audelia Hives [place]

## 2016-04-16 ENCOUNTER — Other Ambulatory Visit: Payer: Self-pay | Admitting: Family Medicine

## 2016-05-17 ENCOUNTER — Other Ambulatory Visit: Payer: Self-pay | Admitting: Family Medicine

## 2016-05-19 ENCOUNTER — Other Ambulatory Visit: Payer: Self-pay

## 2016-05-19 NOTE — Telephone Encounter (Signed)
Pt wants to know if he can get a refill on his valsartan-hydrochlorothiazide till his next follow-up appt on 06/10/16? Pt's last follow-up for hypertension was on 04/24/15. Pt states he does not want to come in sooner due to it being peak flu season.

## 2016-05-20 MED ORDER — VALSARTAN-HYDROCHLOROTHIAZIDE 320-12.5 MG PO TABS: 1.0000 | ORAL_TABLET | Freq: Every day | ORAL | 0 refills | Status: DC

## 2016-05-20 NOTE — Telephone Encounter (Signed)
Pt informed Rx was sent to pharmacy.

## 2016-06-10 ENCOUNTER — Encounter: Payer: Self-pay | Admitting: Family Medicine

## 2016-06-10 ENCOUNTER — Ambulatory Visit (INDEPENDENT_AMBULATORY_CARE_PROVIDER_SITE_OTHER): Payer: BLUE CROSS/BLUE SHIELD | Admitting: Family Medicine

## 2016-06-10 DIAGNOSIS — R7301 Impaired fasting glucose: Secondary | ICD-10-CM | POA: Diagnosis not present

## 2016-06-10 DIAGNOSIS — I10 Essential (primary) hypertension: Secondary | ICD-10-CM | POA: Diagnosis not present

## 2016-06-10 LAB — POCT GLYCOSYLATED HEMOGLOBIN (HGB A1C): Hemoglobin A1C: 5.1

## 2016-06-10 MED ORDER — VALSARTAN-HYDROCHLOROTHIAZIDE 320-12.5 MG PO TABS: 1.0000 | ORAL_TABLET | Freq: Every day | ORAL | 1 refills | Status: DC

## 2016-06-10 NOTE — Progress Notes (Signed)
Subjective:    CC: HTN  HPI: Hypertension- Pt denies chest pain, SOB, dizziness, or heart palpitations.  Taking meds as directed w/o problems.  Denies medication side effects.  He checks his blood pressures at home and says that been very well controlled. His new pill seems to be working more consistently for him and he's tolerated it well.  Impaired fasting glucose-no increased thirst or urination. No symptoms consistent with hypoglycemia. Lab Results  Component Value Date   HGBA1C 5.1 06/10/2016      Past medical history, Surgical history, Family history not pertinant except as noted below, Social history, Allergies, and medications have been entered into the medical record, reviewed, and corrections made.   Review of Systems: No fevers, chills, night sweats, weight loss, chest pain, or shortness of breath.   Objective:    General: Well Developed, well nourished, and in no acute distress.  Neuro: Alert and oriented x3, extra-ocular muscles intact, sensation grossly intact.  HEENT: Normocephalic, atraumatic  Skin: Warm and dry, no rashes. Cardiac: Regular rate and rhythm, no murmurs rubs or gallops, no lower extremity edema.  Respiratory: Clear to auscultation bilaterally. Not using accessory muscles, speaking in full sentences.   Impression and Recommendations:   HTN - Repeat blood pressure borderline today he does report good home blood pressures. Encouraged him to follow-up in a few weeks to recheck blood pressures with a nurse visit. Otherwise I will see him in 6 months.  Due for BMP.   IFG - A1c 5.1 today which looks fantastic and still in the normal range. Continue to monitor every 6-12 months.

## 2016-06-10 NOTE — Patient Instructions (Signed)
Come back in 2 weeks for bp check with nurse

## 2016-06-11 LAB — BASIC METABOLIC PANEL
BUN: 12 mg/dL (ref 7–25)
CHLORIDE: 104 mmol/L (ref 98–110)
CO2: 24 mmol/L (ref 20–31)
Calcium: 9.5 mg/dL (ref 8.6–10.3)
Creat: 0.98 mg/dL (ref 0.70–1.25)
Glucose, Bld: 95 mg/dL (ref 65–99)
POTASSIUM: 4 mmol/L (ref 3.5–5.3)
SODIUM: 142 mmol/L (ref 135–146)

## 2016-06-11 NOTE — Progress Notes (Signed)
All labs are normal. 

## 2016-06-23 ENCOUNTER — Ambulatory Visit: Payer: BLUE CROSS/BLUE SHIELD

## 2016-06-24 ENCOUNTER — Ambulatory Visit: Payer: BLUE CROSS/BLUE SHIELD

## 2016-11-11 ENCOUNTER — Telehealth: Payer: Self-pay | Admitting: Family Medicine

## 2016-11-11 NOTE — Telephone Encounter (Addendum)
Pt called. He states he dropped off Biometric Screening form last week and so far he has not heard anything from [place], form is due by midnight on 8/17.  Thank you.

## 2016-11-12 NOTE — Telephone Encounter (Signed)
Form faxed, confirmation received, copied, and scanned .Angel Flores

## 2016-11-12 NOTE — Telephone Encounter (Signed)
Spoke w/pt and informed him that his form has been completed with the exception of the cholesterol. This reading is over 74yr old and cannot be used. I advised pt that I can fax his form, he is ok with this. Maryruth Eve, L[place] Crocker

## 2016-11-16 ENCOUNTER — Ambulatory Visit: Payer: BLUE CROSS/BLUE SHIELD | Admitting: Family Medicine

## 2016-12-09 ENCOUNTER — Encounter: Payer: Self-pay | Admitting: Family Medicine

## 2016-12-09 ENCOUNTER — Ambulatory Visit (INDEPENDENT_AMBULATORY_CARE_PROVIDER_SITE_OTHER): Payer: BLUE CROSS/BLUE SHIELD | Admitting: Family Medicine

## 2016-12-09 ENCOUNTER — Other Ambulatory Visit: Payer: Self-pay | Admitting: Family Medicine

## 2016-12-09 VITALS — BP 140/78 | HR 77 | Ht 75.0 in | Wt 197.0 lb

## 2016-12-09 DIAGNOSIS — I10 Essential (primary) hypertension: Secondary | ICD-10-CM | POA: Diagnosis not present

## 2016-12-09 DIAGNOSIS — R7301 Impaired fasting glucose: Secondary | ICD-10-CM

## 2016-12-09 DIAGNOSIS — Z23 Encounter for immunization: Secondary | ICD-10-CM | POA: Diagnosis not present

## 2016-12-09 LAB — POCT GLYCOSYLATED HEMOGLOBIN (HGB A1C): Hemoglobin A1C: 5.2

## 2016-12-09 MED ORDER — VALSARTAN-HYDROCHLOROTHIAZIDE 320-12.5 MG PO TABS: 1.0000 | ORAL_TABLET | Freq: Every day | ORAL | 1 refills | Status: DC

## 2016-12-09 NOTE — Progress Notes (Signed)
   Subjective:    Patient ID: Angel Flores, male    DOB: [birth year], 66 y.o.   MRN: [id number]  HPI Hypertension- Pt denies chest pain, SOB, dizziness, or heart palpitations.  Taking meds as directed w/o problems.  Denies medication side effects.    Impaired fasting glucose-no increased thirst or urination. No symptoms consistent with hypoglycemia.   Review of Systems     Objective:   Physical Exam  Constitutional: He is oriented to person, place, and time. He appears well-developed and well-nourished.  HENT:  Head: Normocephalic and atraumatic.  Cardiovascular: Normal rate, regular rhythm and normal heart sounds.   Pulmonary/Chest: Effort normal and breath sounds normal.  Neurological: He is alert and oriented to person, place, and time.  Skin: Skin is warm and dry.  Psychiatric: He has a normal mood and affect. His behavior is normal.        Assessment & Plan:  HTN - blood pressure borderline we'll continue to monitor. Continue current regimen. Follow-up in 6 months.  IFG - 11 A1c looks fantastic today. Continue to monitor. Lab Results  Component Value Date   HGBA1C 5.2 12/09/2016     Prevnar 13 today.   Declined flu vaccine.

## 2016-12-14 DIAGNOSIS — E781 Pure hyperglyceridemia: Secondary | ICD-10-CM | POA: Diagnosis not present

## 2016-12-14 DIAGNOSIS — I10 Essential (primary) hypertension: Secondary | ICD-10-CM | POA: Diagnosis not present

## 2016-12-14 LAB — COMPLETE METABOLIC PANEL WITH GFR
AG Ratio: 2 (calc) (ref 1.0–2.5)
ALKALINE PHOSPHATASE (APISO): 74 U/L (ref 40–115)
ALT: 25 U/L (ref 9–46)
AST: 26 U/L (ref 10–35)
Albumin: 4.5 g/dL (ref 3.6–5.1)
BUN: 12 mg/dL (ref 7–25)
CALCIUM: 9.6 mg/dL (ref 8.6–10.3)
CO2: 28 mmol/L (ref 20–32)
CREATININE: 0.94 mg/dL (ref 0.70–1.25)
Chloride: 105 mmol/L (ref 98–110)
GFR, Est African American: 98 mL/min/{1.73_m2} (ref >=60)
GFR, Est Non African American: 85 mL/min/{1.73_m2} (ref >=60)
GLOBULIN: 2.2 g/dL (ref 1.9–3.7)
GLUCOSE: 102 mg/dL — AB (ref 65–99)
Potassium: 4 mmol/L (ref 3.5–5.3)
SODIUM: 140 mmol/L (ref 135–146)
Total Bilirubin: 0.7 mg/dL (ref 0.2–1.2)
Total Protein: 6.7 g/dL (ref 6.1–8.1)

## 2016-12-14 LAB — LIPID PANEL W/REFLEX DIRECT LDL
CHOL/HDL RATIO: 3.7 (calc) (ref <5.0)
Cholesterol: 175 mg/dL (ref <200)
HDL: 47 mg/dL (ref >40)
LDL CHOLESTEROL (CALC): 109 mg/dL — AB
NON-HDL CHOLESTEROL (CALC): 128 mg/dL (ref <130)
TRIGLYCERIDES: 98 mg/dL (ref <150)

## 2017-02-02 DIAGNOSIS — D3131 Benign neoplasm of right choroid: Secondary | ICD-10-CM | POA: Diagnosis not present

## 2017-06-09 ENCOUNTER — Encounter: Payer: Self-pay | Admitting: Family Medicine

## 2017-06-09 ENCOUNTER — Ambulatory Visit: Payer: BLUE CROSS/BLUE SHIELD | Admitting: Family Medicine

## 2017-06-09 VITALS — BP 155/87 | HR 73 | Ht 75.0 in | Wt 199.0 lb

## 2017-06-09 DIAGNOSIS — I10 Essential (primary) hypertension: Secondary | ICD-10-CM | POA: Diagnosis not present

## 2017-06-09 DIAGNOSIS — Z125 Encounter for screening for malignant neoplasm of prostate: Secondary | ICD-10-CM

## 2017-06-09 DIAGNOSIS — R7301 Impaired fasting glucose: Secondary | ICD-10-CM | POA: Diagnosis not present

## 2017-06-09 DIAGNOSIS — M79642 Pain in left hand: Secondary | ICD-10-CM | POA: Diagnosis not present

## 2017-06-09 MED ORDER — VALSARTAN-HYDROCHLOROTHIAZIDE 320-12.5 MG PO TABS: 1.0000 | ORAL_TABLET | Freq: Every day | ORAL | 1 refills | Status: DC

## 2017-06-09 NOTE — Progress Notes (Signed)
Subjective:    CC: BP, A1C   HPI:  Hypertension- Pt denies chest pain, SOB, dizziness, or heart palpitations.  Taking meds as directed w/o problems.  Denies medication side effects.    Impaired fasting glucose-no increased thirst or urination. No symptoms consistent with hypoglycemia.  He is also having pain in the pad of the left hand over the thenar eminence.  He is right handed.  Denies any known injury or trauma.  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  He says sometimes even just small activities like turning or opening the blinds he will feel a discomfort.  He says is really not painful as it is just a discomfort.  No numbness or tingling.   Past medical history, Surgical history, Family history not pertinant except as noted below, Social history, Allergies, and medications have been entered into the medical record, reviewed, and corrections made.   Review of Systems: No fevers, chills, night sweats, weight loss, chest pain, or shortness of breath.   Objective:    General: Well Developed, well nourished, and in no acute distress.  Neuro: Alert and oriented x3, extra-ocular muscles intact, sensation grossly intact.  HEENT: Normocephalic, atraumatic  Skin: Warm and dry, no rashes. Cardiac: Regular rate and rhythm, no murmurs rubs or gallops, no lower extremity edema.  Respiratory: Clear to auscultation bilaterally. Not using accessory muscles, speaking in full sentences. MSK: Hand with normal range of motion and appearance of the joints and fingers.  Good strength between the thumb and the first finger.  Nontender over the thenar eminence.  Normal range of motion.  Nontender over the wrist.   Impression and Recommendations:    HTN -pressure slightly elevated today but he reports home blood pressures have been well controlled typically in the mid 130s over low 80s.  We will continue current regimen.  Follow-up in 6 months.  IFG  -due to recheck hemoglobin  A1c.  Left hand pain -unclear etiology.  Could be arthritis at the base of the thumb versus a tendinitis.  Recommend trial of anti-inflammatory and rest if able to do so.  Certainly if it becomes painful, swollen, or red then please let us know.

## 2017-06-10 LAB — BASIC METABOLIC PANEL WITH GFR
BUN: 13 mg/dL (ref 7–25)
CALCIUM: 9.6 mg/dL (ref 8.6–10.3)
CO2: 28 mmol/L (ref 20–32)
Chloride: 105 mmol/L (ref 98–110)
Creat: 0.9 mg/dL (ref 0.70–1.25)
GFR, EST AFRICAN AMERICAN: 104 mL/min/{1.73_m2} (ref >=60)
GFR, EST NON AFRICAN AMERICAN: 89 mL/min/{1.73_m2} (ref >=60)
Glucose, Bld: 89 mg/dL (ref 65–99)
POTASSIUM: 3.9 mmol/L (ref 3.5–5.3)
Sodium: 140 mmol/L (ref 135–146)

## 2017-06-10 LAB — HEMOGLOBIN A1C
Hgb A1c MFr Bld: 5.1 % of total Hgb (ref <5.7)
MEAN PLASMA GLUCOSE: 100 (calc)
eAG (mmol/L): 5.5 (calc)

## 2017-06-10 LAB — PSA: PSA: 0.8 ng/mL (ref <=4.0)

## 2017-07-01 ENCOUNTER — Emergency Department
Admission: EM | Admit: 2017-07-01 | Discharge: 2017-07-01 | Disposition: A | Discharge status: Home / Self Care | Payer: BLUE CROSS/BLUE SHIELD | Source: Home / Self Care | Attending: Family Medicine | Admitting: Family Medicine

## 2017-07-01 ENCOUNTER — Other Ambulatory Visit: Payer: Self-pay

## 2017-07-01 ENCOUNTER — Emergency Department (INDEPENDENT_AMBULATORY_CARE_PROVIDER_SITE_OTHER): Payer: BLUE CROSS/BLUE SHIELD

## 2017-07-01 ENCOUNTER — Encounter: Payer: Self-pay | Admitting: Emergency Medicine

## 2017-07-01 DIAGNOSIS — M19041 Primary osteoarthritis, right hand: Secondary | ICD-10-CM | POA: Diagnosis not present

## 2017-07-01 LAB — POCT CBC W AUTO DIFF (K'VILLE URGENT CARE)

## 2017-07-01 MED ORDER — PREDNISONE 20 MG PO TABS: ORAL_TABLET | ORAL | 0 refills | Status: DC

## 2017-07-01 MED ORDER — DOXYCYCLINE HYCLATE 100 MG PO CAPS: 100.0000 mg | ORAL_CAPSULE | Freq: Two times a day (BID) | ORAL | 0 refills | Status: DC
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
## 2017-07-01 NOTE — Discharge Instructions (Addendum)
May take Tylenol as needed for pain. 

## 2017-07-01 NOTE — ED Triage Notes (Signed)
Patient awoke yesterday with large circular area of redness and edema on back of right hand. The are is tender to touch. Ibuprofen has helped with discomfort.

## 2017-07-02 LAB — URIC ACID: Uric Acid, Serum: 4.8 mg/dL (ref 4.0–8.0)

## 2017-07-27 DIAGNOSIS — Z85828 Personal history of other malignant neoplasm of skin: Secondary | ICD-10-CM | POA: Diagnosis not present

## 2017-07-27 DIAGNOSIS — Z08 Encounter for follow-up examination after completed treatment for malignant neoplasm: Secondary | ICD-10-CM | POA: Diagnosis not present

## 2017-07-27 DIAGNOSIS — D2239 Melanocytic nevi of other parts of face: Secondary | ICD-10-CM | POA: Diagnosis not present

## 2017-07-27 DIAGNOSIS — D1801 Hemangioma of skin and subcutaneous tissue: Secondary | ICD-10-CM | POA: Diagnosis not present

## 2017-07-27 DIAGNOSIS — L570 Actinic keratosis: Secondary | ICD-10-CM | POA: Diagnosis not present

## 2017-11-04 ENCOUNTER — Telehealth: Payer: Self-pay | Admitting: *Deleted

## 2017-11-04 NOTE — Telephone Encounter (Signed)
Wellness form completed, faxed, confirmation received, and scanned into chart.Angel Flores, Angel Flores

## 2017-12-04 ENCOUNTER — Other Ambulatory Visit: Payer: Self-pay | Admitting: Family Medicine

## 2017-12-04 DIAGNOSIS — I10 Essential (primary) hypertension: Secondary | ICD-10-CM

## 2017-12-06 ENCOUNTER — Encounter: Payer: Self-pay | Admitting: Family Medicine

## 2017-12-06 ENCOUNTER — Ambulatory Visit: Payer: BLUE CROSS/BLUE SHIELD | Admitting: Family Medicine

## 2017-12-06 VITALS — BP 136/78 | HR 82 | Ht 75.0 in | Wt 196.0 lb

## 2017-12-06 DIAGNOSIS — M1812 Unilateral primary osteoarthritis of first carpometacarpal joint, left hand: Secondary | ICD-10-CM

## 2017-12-06 DIAGNOSIS — Z23 Encounter for immunization: Secondary | ICD-10-CM

## 2017-12-06 DIAGNOSIS — I10 Essential (primary) hypertension: Secondary | ICD-10-CM

## 2017-12-06 DIAGNOSIS — R7301 Impaired fasting glucose: Secondary | ICD-10-CM

## 2017-12-06 LAB — POCT GLYCOSYLATED HEMOGLOBIN (HGB A1C): HEMOGLOBIN A1C: 5.2 % (ref 4.0–5.6)

## 2017-12-06 NOTE — Progress Notes (Signed)
Subjective:    CC: BP and glucose  HPI: Hypertension- Pt denies chest pain, SOB, dizziness, or heart palpitations.  Taking meds as directed w/o problems.  Denies medication side effects.  Ports home blood pressures have been running usually in the low 130s over 80s.  Impaired fasting glucose-no increased thirst or urination. No symptoms consistent with hypoglycemia.  He wanted to keep me updated on his left thumb pain is at the base near his wrist.  He says it has not really gotten worse but also has not gotten better.  It will bother him more with small twisting motions like opening the blinds or grabbing a doorknob.  Sometimes if he uses pliers he will feel like his left hand is just a little weaker compared to his right.  Past medical history, Surgical history, Family history not pertinant except as noted below, Social history, Allergies, and medications have been entered into the medical record, reviewed, and corrections made.   Review of Systems: No fevers, chills, night sweats, weight loss, chest pain, or shortness of breath.   Objective:    General: Well Developed, well nourished, and in no acute distress.  Neuro: Alert and oriented x3, extra-ocular muscles intact, sensation grossly intact.  HEENT: Normocephalic, atraumatic  Skin: Warm and dry, no rashes. Cardiac: Regular rate and rhythm, no murmurs rubs or gallops, no lower extremity edema.  Respiratory: Clear to auscultation bilaterally. Not using accessory muscles, speaking in full sentences. MSK: Some mild tenderness at [facility] joint especially when I move the thumb around.  No significant swelling.  No atrophy of the thenar eminence.  Strength is 5 out of 5 in all directions.  Negative Finkelstein's test.   Impression and Recommendations:    HTN -BP acceptable today.  A little bit on the higher end but it is okay we will continue to monitor and continue current regimen.  Follow-up in 6 months.  IFG - Well controlled. Continue  current regimen. Follow up in  69months.   Pneumovax 23 given today.  He had the Prevnar 13 almost exactly a year ago.  CMC joint pain/Left thumb pain at the wrist-likely related to osteoarthritis.  We discussed options including further work-up to include x-rays possible referral to sports medicine for an injection, or just keeping an eye on it.  Right now he uses Biofreeze when needed and says that is helpful so for now he will just keep an eye on it and continue to use that as needed.  Declined flu vaccine.  Says he will get it done at work.  Reminded him that he is due for colon cancer screening for 10-year follow-up at the end of the year.  Last colonoscopy was December 2009.

## 2017-12-08 DIAGNOSIS — I10 Essential (primary) hypertension: Secondary | ICD-10-CM | POA: Diagnosis not present

## 2017-12-08 DIAGNOSIS — R7301 Impaired fasting glucose: Secondary | ICD-10-CM | POA: Diagnosis not present

## 2017-12-09 LAB — COMPLETE METABOLIC PANEL WITH GFR
AG Ratio: 1.8 (calc) (ref 1.0–2.5)
ALT: 22 U/L (ref 9–46)
AST: 27 U/L (ref 10–35)
Albumin: 4.2 g/dL (ref 3.6–5.1)
Alkaline phosphatase (APISO): 75 U/L (ref 40–115)
BUN: 12 mg/dL (ref 7–25)
CALCIUM: 9.6 mg/dL (ref 8.6–10.3)
CO2: 27 mmol/L (ref 20–32)
CREATININE: 0.95 mg/dL (ref 0.70–1.25)
Chloride: 103 mmol/L (ref 98–110)
GFR, Est African American: 96 mL/min/{1.73_m2} (ref >=60)
GFR, Est Non African American: 83 mL/min/{1.73_m2} (ref >=60)
GLOBULIN: 2.4 g/dL (ref 1.9–3.7)
Glucose, Bld: 97 mg/dL (ref 65–99)
Potassium: 4 mmol/L (ref 3.5–5.3)
SODIUM: 139 mmol/L (ref 135–146)
Total Bilirubin: 0.7 mg/dL (ref 0.2–1.2)
Total Protein: 6.6 g/dL (ref 6.1–8.1)

## 2017-12-09 LAB — LIPID PANEL
CHOL/HDL RATIO: 3.4 (calc) (ref <5.0)
Cholesterol: 154 mg/dL (ref <200)
HDL: 45 mg/dL (ref >40)
LDL CHOLESTEROL (CALC): 91 mg/dL
Non-HDL Cholesterol (Calc): 109 mg/dL (calc) (ref <130)
Triglycerides: 88 mg/dL (ref <150)

## 2017-12-09 NOTE — Progress Notes (Signed)
All labs are normal. 

## 2018-03-04 ENCOUNTER — Other Ambulatory Visit: Payer: Self-pay | Admitting: Family Medicine

## 2018-03-04 DIAGNOSIS — I10 Essential (primary) hypertension: Secondary | ICD-10-CM

## 2018-06-01 ENCOUNTER — Ambulatory Visit: Payer: BLUE CROSS/BLUE SHIELD | Admitting: Family Medicine

## 2018-06-01 ENCOUNTER — Encounter: Payer: Self-pay | Admitting: Family Medicine

## 2018-06-01 VITALS — BP 133/72 | HR 66 | Ht 75.0 in | Wt 195.0 lb

## 2018-06-01 DIAGNOSIS — Z125 Encounter for screening for malignant neoplasm of prostate: Secondary | ICD-10-CM | POA: Diagnosis not present

## 2018-06-01 DIAGNOSIS — I10 Essential (primary) hypertension: Secondary | ICD-10-CM

## 2018-06-01 DIAGNOSIS — R7301 Impaired fasting glucose: Secondary | ICD-10-CM | POA: Diagnosis not present

## 2018-06-01 LAB — POCT GLYCOSYLATED HEMOGLOBIN (HGB A1C): Hemoglobin A1C: 5.2 % (ref 4.0–5.6)

## 2018-06-01 NOTE — Progress Notes (Signed)
Established Patient Office Visit  Subjective:  Patient ID: Angel Flores, male    DOB: Sep 12, 1950  Age: 68 y.o. MRN: [id number]  CC:  Chief Complaint  Patient presents with  . Hypertension  . ifg    HPI Angel Flores presents for HTN  Hypertension- Pt denies chest pain, SOB, dizziness, or heart palpitations.  Taking meds as directed w/o problems.  Denies medication side effects.    Impaired fasting glucose-no increased thirst or urination. No symptoms consistent with hypoglycemia.  Is planning on retiring in August near his birthday.  He would like to wait and do his colonoscopy after he retires.   Past Medical History:  Diagnosis Date  . Facial injury    truamatic/both sinuses shattered  . Hypertension   . Psoriasis     Past Surgical History:  Procedure Laterality Date  . NO PAST SURGERIES      Family History  Problem Relation Age of Onset  . Breast cancer Mother   . Heart failure Mother   . Dementia Mother     Social History   Socioeconomic History  . Marital status: Married    Spouse name: Not on file  . Number of children: Not on file  . Years of education: Not on file  . Highest education level: Not on file  Occupational History  . Not on file  Social Needs  . Financial resource strain: Not on file  . Food insecurity:    Worry: Not on file    Inability: Not on file  . Transportation needs:    Medical: Not on file    Non-medical: Not on file  Tobacco Use  . Smoking status: Never Smoker  . Smokeless tobacco: Never Used  Substance and Sexual Activity  . Alcohol use: Yes  . Drug use: No  . Sexual activity: Yes  Lifestyle  . Physical activity:    Days per week: Not on file    Minutes per session: Not on file  . Stress: Not on file  Relationships  . Social connections:    Talks on phone: Not on file    Gets together: Not on file    Attends religious service: Not on file    Active member of club or organization: Not on file    Attends  meetings of clubs or organizations: Not on file    Relationship status: Not on file  . Intimate partner violence:    Fear of current or ex partner: Not on file    Emotionally abused: Not on file    Physically abused: Not on file    Forced sexual activity: Not on file  Other Topics Concern  . Not on file  Social History Narrative  . Not on file    Outpatient Medications Prior to Visit  Medication Sig Dispense Refill  . valsartan-hydrochlorothiazide (DIOVAN-HCT) 320-12.5 MG tablet TAKE 1 TABLET BY MOUTH DAILY 90 tablet 0   No facility-administered medications prior to visit.     No Known Allergies  ROS Review of Systems    Objective:    Physical Exam  Constitutional: He is oriented to person, place, and time. He appears well-developed and well-nourished.  HENT:  Head: Normocephalic and atraumatic.  Cardiovascular: Normal rate, regular rhythm and normal heart sounds.  Pulmonary/Chest: Effort normal and breath sounds normal.  Neurological: He is alert and oriented to person, place, and time.  Skin: Skin is warm and dry.  Psychiatric: He has a normal mood and affect. His  behavior is normal.    BP 133/72   Pulse 66   Ht 6\' 3"  (1.905 m)   Wt 195 lb (88.5 kg)   SpO2 100%   BMI 24.37 kg/m  Wt Readings from Last 3 Encounters:  06/01/18 195 lb (88.5 kg)  12/06/17 196 lb (88.9 kg)  07/01/17 193 lb (87.5 kg)     There are no preventive care reminders to display for this patient.  There are no preventive care reminders to display for this patient.  Lab Results  Component Value Date   TSH 2.999 01/10/2009   Lab Results  Component Value Date   WBC 9.5 08/28/2012   HGB 13.6 08/28/2012   HCT 39.3 08/28/2012   MCV 90.3 08/28/2012   PLT 224 08/28/2012   Lab Results  Component Value Date   NA 139 12/08/2017   K 4.0 12/08/2017   CO2 27 12/08/2017   GLUCOSE 97 12/08/2017   BUN 12 12/08/2017   CREATININE 0.95 12/08/2017   BILITOT 0.7 12/08/2017   ALKPHOS 65  10/01/2015   AST 27 12/08/2017   ALT 22 12/08/2017   PROT 6.6 12/08/2017   ALBUMIN 4.7 10/01/2015   CALCIUM 9.6 12/08/2017   Lab Results  Component Value Date   CHOL 154 12/08/2017   Lab Results  Component Value Date   HDL 45 12/08/2017   Lab Results  Component Value Date   LDLCALC 91 12/08/2017   Lab Results  Component Value Date   TRIG 88 12/08/2017   Lab Results  Component Value Date   CHOLHDL 3.4 12/08/2017   Lab Results  Component Value Date   HGBA1C 5.2 06/01/2018      Assessment & Plan:   Problem List Items Addressed This Visit      Cardiovascular and Mediastinum   HYPERTENSION   Relevant Orders   POCT glycosylated hemoglobin (Hb A1C) (Completed)   BASIC METABOLIC PANEL WITH GFR   PSA     Endocrine   IMPAIRED FASTING GLUCOSE - Primary   Relevant Orders   POCT glycosylated hemoglobin (Hb A1C) (Completed)   BASIC METABOLIC PANEL WITH GFR   PSA    Other Visit Diagnoses    Prostate cancer screening       Relevant Orders   POCT glycosylated hemoglobin (Hb A1C) (Completed)   BASIC METABOLIC PANEL WITH GFR   PSA      HTN - Well controlled. Continue current regimen. Follow up in  6 months.  For BMP today.  IFG -  Well controlled. Continue current regimen. Follow up in  6 months.    Due for screening PSA.  Discussed colon cancer screening.    No orders of the defined types were placed in this encounter.   Follow-up: Return in about 6 months (around 12/02/2018) for BP and discuss colon cancer screening.    Beatrice Lecher, MD

## 2018-06-02 LAB — BASIC METABOLIC PANEL WITH GFR
BUN: 12 mg/dL (ref 7–25)
CO2: 30 mmol/L (ref 20–32)
CREATININE: 1.04 mg/dL (ref 0.70–1.25)
Calcium: 9.8 mg/dL (ref 8.6–10.3)
Chloride: 105 mmol/L (ref 98–110)
GFR, EST NON AFRICAN AMERICAN: 74 mL/min/{1.73_m2} (ref >=60)
GFR, Est African American: 86 mL/min/{1.73_m2} (ref >=60)
Glucose, Bld: 93 mg/dL (ref 65–99)
Potassium: 3.9 mmol/L (ref 3.5–5.3)
SODIUM: 142 mmol/L (ref 135–146)

## 2018-06-02 LAB — PSA: PSA: 1 ng/mL (ref <=4.0)

## 2018-06-02 NOTE — Progress Notes (Signed)
All labs are normal. 

## 2018-06-04 ENCOUNTER — Other Ambulatory Visit: Payer: Self-pay | Admitting: Family Medicine

## 2018-06-04 DIAGNOSIS — I10 Essential (primary) hypertension: Secondary | ICD-10-CM

## 2018-11-20 DIAGNOSIS — Z85828 Personal history of other malignant neoplasm of skin: Secondary | ICD-10-CM | POA: Diagnosis not present

## 2018-11-20 DIAGNOSIS — L570 Actinic keratosis: Secondary | ICD-10-CM | POA: Diagnosis not present

## 2018-11-20 DIAGNOSIS — L821 Other seborrheic keratosis: Secondary | ICD-10-CM | POA: Diagnosis not present

## 2018-11-20 DIAGNOSIS — L408 Other psoriasis: Secondary | ICD-10-CM | POA: Diagnosis not present

## 2018-12-05 ENCOUNTER — Other Ambulatory Visit: Payer: Self-pay

## 2018-12-05 ENCOUNTER — Ambulatory Visit (INDEPENDENT_AMBULATORY_CARE_PROVIDER_SITE_OTHER): Payer: Medicare Other | Admitting: Family Medicine

## 2018-12-05 ENCOUNTER — Encounter: Payer: Self-pay | Admitting: Family Medicine

## 2018-12-05 VITALS — BP 138/80 | HR 68 | Temp 98.8°F | Ht 75.0 in | Wt 190.0 lb

## 2018-12-05 DIAGNOSIS — I10 Essential (primary) hypertension: Secondary | ICD-10-CM

## 2018-12-05 DIAGNOSIS — Z1211 Encounter for screening for malignant neoplasm of colon: Secondary | ICD-10-CM

## 2018-12-05 DIAGNOSIS — R7301 Impaired fasting glucose: Secondary | ICD-10-CM | POA: Diagnosis not present

## 2018-12-05 DIAGNOSIS — Z23 Encounter for immunization: Secondary | ICD-10-CM | POA: Diagnosis not present

## 2018-12-05 LAB — POCT GLYCOSYLATED HEMOGLOBIN (HGB A1C): Hemoglobin A1C: 5.2 % (ref 4.0–5.6)

## 2018-12-05 MED ORDER — VALSARTAN-HYDROCHLOROTHIAZIDE 320-12.5 MG PO TABS: 1.0000 | ORAL_TABLET | Freq: Every day | ORAL | 1 refills | Status: DC

## 2018-12-05 NOTE — Assessment & Plan Note (Signed)
Blood pressure is okay today.  He still will monitor monthly and says he is been getting great blood pressures at home.  Call if any problems or concerns.  Due for updated labs.  Follow-up in 6 months.  Refills sent for 6 months.

## 2018-12-05 NOTE — Assessment & Plan Note (Signed)
Looks great with A1C of 5.2 today. Recheck in one year.

## 2018-12-05 NOTE — Addendum Note (Signed)
Addended by: Teddy Spike on: 12/05/2018 11:41 AM   Modules accepted: Orders

## 2018-12-05 NOTE — Progress Notes (Signed)
Established Patient Office Visit  Subjective:  Patient ID: Angel Flores, male    DOB: [birth year]  Age: 68 y.o. MRN: [id number]  CC:  Chief Complaint  Patient presents with  . Hypertension  . ifg    HPI Angel Flores presents for   Hypertension- Pt denies chest pain, SOB, dizziness, or heart palpitations.  Taking meds as directed w/o problems.  Denies medication side effects.    Impaired fasting glucose-no increased thirst or urination. No symptoms consistent with hypoglycemia.  He has now retired and has been staying busy with projects.  In fact he says he had lost about 5 pounds.   Past Medical History:  Diagnosis Date  . Facial injury    truamatic/both sinuses shattered  . Hypertension   . Psoriasis     Past Surgical History:  Procedure Laterality Date  . NO PAST SURGERIES      Family History  Problem Relation Age of Onset  . Breast cancer Mother   . Heart failure Mother   . Dementia Mother     Social History   Socioeconomic History  . Marital status: Married    Spouse name: Not on file  . Number of children: Not on file  . Years of education: Not on file  . Highest education level: Not on file  Occupational History  . Not on file  Social Needs  . Financial resource strain: Not on file  . Food insecurity    Worry: Not on file    Inability: Not on file  . Transportation needs    Medical: Not on file    Non-medical: Not on file  Tobacco Use  . Smoking status: Never Smoker  . Smokeless tobacco: Never Used  Substance and Sexual Activity  . Alcohol use: Yes  . Drug use: No  . Sexual activity: Yes  Lifestyle  . Physical activity    Days per week: Not on file    Minutes per session: Not on file  . Stress: Not on file  Relationships  . Social Herbalist on phone: Not on file    Gets together: Not on file    Attends religious service: Not on file    Active member of club or organization: Not on file    Attends meetings of clubs or  organizations: Not on file    Relationship status: Not on file  . Intimate partner violence    Fear of current or ex partner: Not on file    Emotionally abused: Not on file    Physically abused: Not on file    Forced sexual activity: Not on file  Other Topics Concern  . Not on file  Social History Narrative  . Not on file    Outpatient Medications Prior to Visit  Medication Sig Dispense Refill  . augmented betamethasone dipropionate (DIPROLENE-AF) 0.05 % cream APP EXT AA BID    . valsartan-hydrochlorothiazide (DIOVAN-HCT) 320-12.5 MG tablet TAKE 1 TABLET BY MOUTH DAILY 90 tablet 1   No facility-administered medications prior to visit.     No Known Allergies  ROS Review of Systems    Objective:    Physical Exam  Constitutional: He is oriented to person, place, and time. He appears well-developed and well-nourished.  HENT:  Head: Normocephalic and atraumatic.  Cardiovascular: Normal rate, regular rhythm and normal heart sounds.  Pulmonary/Chest: Effort normal and breath sounds normal.  Neurological: He is alert and oriented to person, place, and time.  Skin:  Skin is warm and dry.  Psychiatric: He has a normal mood and affect. His behavior is normal.    BP 138/80   Pulse 68   Temp 98.8 F (37.1 C)   Ht 6\' 3"  (1.905 m)   Wt 190 lb (86.2 kg)   SpO2 98%   BMI 23.75 kg/m  Wt Readings from Last 3 Encounters:  12/05/18 190 lb (86.2 kg)  06/01/18 195 lb (88.5 kg)  12/06/17 196 lb (88.9 kg)     Health Maintenance Due  Topic Date Due  . COLONOSCOPY  02/26/2018  . INFLUENZA VACCINE  10/28/2018    There are no preventive care reminders to display for this patient.  Lab Results  Component Value Date   TSH 2.999 01/10/2009   Lab Results  Component Value Date   WBC 9.5 08/28/2012   HGB 13.6 08/28/2012   HCT 39.3 08/28/2012   MCV 90.3 08/28/2012   PLT 224 08/28/2012   Lab Results  Component Value Date   NA 142 06/01/2018   K 3.9 06/01/2018   CO2 30  06/01/2018   GLUCOSE 93 06/01/2018   BUN 12 06/01/2018   CREATININE 1.04 06/01/2018   BILITOT 0.7 12/08/2017   ALKPHOS 65 10/01/2015   AST 27 12/08/2017   ALT 22 12/08/2017   PROT 6.6 12/08/2017   ALBUMIN 4.7 10/01/2015   CALCIUM 9.8 06/01/2018   Lab Results  Component Value Date   CHOL 154 12/08/2017   Lab Results  Component Value Date   HDL 45 12/08/2017   Lab Results  Component Value Date   LDLCALC 91 12/08/2017   Lab Results  Component Value Date   TRIG 88 12/08/2017   Lab Results  Component Value Date   CHOLHDL 3.4 12/08/2017   Lab Results  Component Value Date   HGBA1C 5.2 12/05/2018      Assessment & Plan:   Problem List Items Addressed This Visit      Cardiovascular and Mediastinum   HYPERTENSION - Primary    Blood pressure is okay today.  He still will monitor monthly and says he is been getting great blood pressures at home.  Call if any problems or concerns.  Due for updated labs.  Follow-up in 6 months.  Refills sent for 6 months.      Relevant Medications   valsartan-hydrochlorothiazide (DIOVAN-HCT) 320-12.5 MG tablet   Other Relevant Orders   COMPLETE METABOLIC PANEL WITH GFR   Lipid panel     Endocrine   IMPAIRED FASTING GLUCOSE    Looks great with A1C of 5.2 today. Recheck in one year.        Relevant Orders   COMPLETE METABOLIC PANEL WITH GFR   Lipid panel   POCT glycosylated hemoglobin (Hb A1C) (Completed)    Other Visit Diagnoses    Need for immunization against influenza       Relevant Orders   Flu Vaccine QUAD High Dose(Fluad) (Completed)   Colon cancer screening         Discussed colon cancer screening. Ok with cologuard.  Order placed today.  Flu vaccine given.  Meds ordered this encounter  Medications  . valsartan-hydrochlorothiazide (DIOVAN-HCT) 320-12.5 MG tablet    Sig: Take 1 tablet by mouth daily.    Dispense:  90 tablet    Refill:  1    Follow-up: Return in about 6 months (around 06/04/2019) for  Hypertension.    Beatrice Lecher, MD

## 2018-12-06 LAB — COMPLETE METABOLIC PANEL WITH GFR
AG Ratio: 1.8 (calc) (ref 1.0–2.5)
ALT: 26 U/L (ref 9–46)
AST: 28 U/L (ref 10–35)
Albumin: 4.4 g/dL (ref 3.6–5.1)
Alkaline phosphatase (APISO): 60 U/L (ref 35–144)
BUN: 16 mg/dL (ref 7–25)
CO2: 27 mmol/L (ref 20–32)
Calcium: 9.9 mg/dL (ref 8.6–10.3)
Chloride: 106 mmol/L (ref 98–110)
Creat: 0.91 mg/dL (ref 0.70–1.25)
GFR, Est African American: 101 mL/min/{1.73_m2} (ref >=60)
GFR, Est Non African American: 87 mL/min/{1.73_m2} (ref >=60)
Globulin: 2.5 g/dL (calc) (ref 1.9–3.7)
Glucose, Bld: 101 mg/dL — ABNORMAL HIGH (ref 65–99)
Potassium: 4 mmol/L (ref 3.5–5.3)
Sodium: 140 mmol/L (ref 135–146)
Total Bilirubin: 0.9 mg/dL (ref 0.2–1.2)
Total Protein: 6.9 g/dL (ref 6.1–8.1)

## 2018-12-06 LAB — LIPID PANEL
Cholesterol: 184 mg/dL (ref <200)
HDL: 52 mg/dL (ref >=40)
LDL Cholesterol (Calc): 116 mg/dL (calc) — ABNORMAL HIGH
Non-HDL Cholesterol (Calc): 132 mg/dL (calc) — ABNORMAL HIGH (ref <130)
Total CHOL/HDL Ratio: 3.5 (calc) (ref <5.0)
Triglycerides: 72 mg/dL (ref <150)

## 2018-12-08 ENCOUNTER — Other Ambulatory Visit: Payer: Self-pay | Admitting: Family Medicine

## 2018-12-08 DIAGNOSIS — I10 Essential (primary) hypertension: Secondary | ICD-10-CM

## 2018-12-20 LAB — COLOGUARD: Cologuard: POSITIVE — AB

## 2018-12-21 ENCOUNTER — Other Ambulatory Visit: Payer: Self-pay | Admitting: *Deleted

## 2018-12-21 DIAGNOSIS — Z1211 Encounter for screening for malignant neoplasm of colon: Secondary | ICD-10-CM

## 2019-05-29 ENCOUNTER — Encounter: Payer: Self-pay | Admitting: Family Medicine

## 2019-05-30 ENCOUNTER — Other Ambulatory Visit: Payer: Self-pay | Admitting: Family Medicine

## 2019-05-30 DIAGNOSIS — I10 Essential (primary) hypertension: Secondary | ICD-10-CM

## 2019-05-31 NOTE — Telephone Encounter (Signed)
ERROR

## 2019-06-05 ENCOUNTER — Ambulatory Visit (INDEPENDENT_AMBULATORY_CARE_PROVIDER_SITE_OTHER): Payer: Medicare Other | Admitting: Family Medicine

## 2019-06-05 ENCOUNTER — Other Ambulatory Visit: Payer: Self-pay

## 2019-06-05 ENCOUNTER — Encounter: Payer: Self-pay | Admitting: Family Medicine

## 2019-06-05 ENCOUNTER — Other Ambulatory Visit: Payer: Self-pay | Admitting: *Deleted

## 2019-06-05 VITALS — BP 142/76 | HR 77 | Ht 75.0 in | Wt 188.0 lb

## 2019-06-05 DIAGNOSIS — I10 Essential (primary) hypertension: Secondary | ICD-10-CM

## 2019-06-05 DIAGNOSIS — Z23 Encounter for immunization: Secondary | ICD-10-CM

## 2019-06-05 DIAGNOSIS — R7301 Impaired fasting glucose: Secondary | ICD-10-CM

## 2019-06-05 DIAGNOSIS — Z125 Encounter for screening for malignant neoplasm of prostate: Secondary | ICD-10-CM | POA: Diagnosis not present

## 2019-06-05 MED ORDER — VALSARTAN-HYDROCHLOROTHIAZIDE 320-25 MG PO TABS: 1.0000 | ORAL_TABLET | Freq: Every day | ORAL | 0 refills | Status: DC

## 2019-06-05 MED ORDER — VALSARTAN-HYDROCHLOROTHIAZIDE 320-25 MG PO TABS: 1.0000 | ORAL_TABLET | Freq: Every day | ORAL | 1 refills | Status: DC

## 2019-06-05 NOTE — Patient Instructions (Signed)
I encourage you to get your tetanus and your first shingles vaccine done at the pharmacy at least 2 weeks out after your Covid vaccine.

## 2019-06-05 NOTE — Assessment & Plan Note (Signed)
Due for A1C  

## 2019-06-05 NOTE — Assessment & Plan Note (Addendum)
Control is fair.  Would really like to see his systolic pressure in the [id number] to low 130s.  Going to adjust his Diovan HCTZ dose.  Can follow-up in 2 to 3 weeks with nurse visit.. Continue current regimen. Follow up in  6 mo with me.Marland Kitchen

## 2019-06-05 NOTE — Progress Notes (Signed)
Established Patient Office Visit  Subjective:  Patient ID: Angel Flores, male    DOB: Apr 17, 1950  Age: 69 y.o. MRN: [id number]  CC:  Chief Complaint  Patient presents with  . Hypertension    HPI Angel Flores presents for   Hypertension- Pt denies chest pain, SOB, dizziness, or heart palpitations.  Taking meds as directed w/o problems.  Denies medication side effects.    Impaired fasting glucose-no increased thirst or urination. No symptoms consistent with hypoglycemia.   Past Medical History:  Diagnosis Date  . Facial injury    truamatic/both sinuses shattered  . Hypertension   . Psoriasis     Past Surgical History:  Procedure Laterality Date  . NO PAST SURGERIES      Family History  Problem Relation Age of Onset  . Breast cancer Mother   . Heart failure Mother   . Dementia Mother     Social History   Socioeconomic History  . Marital status: Married    Spouse name: Not on file  . Number of children: Not on file  . Years of education: Not on file  . Highest education level: Not on file  Occupational History  . Not on file  Tobacco Use  . Smoking status: Never Smoker  . Smokeless tobacco: Never Used  Substance and Sexual Activity  . Alcohol use: Yes  . Drug use: No  . Sexual activity: Yes  Other Topics Concern  . Not on file  Social History Narrative  . Not on file   Social Determinants of Health   Financial Resource Strain:   . Difficulty of Paying Living Expenses: Not on file  Food Insecurity:   . Worried About Charity fundraiser in the Last Year: Not on file  . Ran Out of Food in the Last Year: Not on file  Transportation Needs:   . Lack of Transportation (Medical): Not on file  . Lack of Transportation (Non-Medical): Not on file  Physical Activity:   . Days of Exercise per Week: Not on file  . Minutes of Exercise per Session: Not on file  Stress:   . Feeling of Stress : Not on file  Social Connections:   . Frequency of  Communication with Friends and Family: Not on file  . Frequency of Social Gatherings with Friends and Family: Not on file  . Attends Religious Services: Not on file  . Active Member of Clubs or Organizations: Not on file  . Attends Archivist Meetings: Not on file  . Marital Status: Not on file  Intimate Partner Violence:   . Fear of Current or Ex-Partner: Not on file  . Emotionally Abused: Not on file  . Physically Abused: Not on file  . Sexually Abused: Not on file    Outpatient Medications Prior to Visit  Medication Sig Dispense Refill  . augmented betamethasone dipropionate (DIPROLENE-AF) 0.05 % cream APP EXT AA BID    . valsartan-hydrochlorothiazide (DIOVAN-HCT) 320-12.5 MG tablet TAKE 1 TABLET BY MOUTH EVERY DAY 90 tablet 1   No facility-administered medications prior to visit.    No Known Allergies  ROS Review of Systems    Objective:    Physical Exam  Constitutional: He is oriented to person, place, and time. He appears well-developed and well-nourished.  HENT:  Head: Normocephalic and atraumatic.  Cardiovascular: Normal rate, regular rhythm and normal heart sounds.  Pulmonary/Chest: Effort normal and breath sounds normal.  Neurological: He is alert and oriented to person, place,  and time.  Skin: Skin is warm and dry.  Psychiatric: He has a normal mood and affect. His behavior is normal.    BP (!) 142/76   Pulse 77   Ht 6\' 3"  (1.905 m)   Wt 188 lb (85.3 kg)   SpO2 98%   BMI 23.50 kg/m  Wt Readings from Last 3 Encounters:  06/05/19 188 lb (85.3 kg)  12/05/18 190 lb (86.2 kg)  06/01/18 195 lb (88.5 kg)     There are no preventive care reminders to display for this patient.  There are no preventive care reminders to display for this patient.  Lab Results  Component Value Date   TSH 2.999 01/10/2009   Lab Results  Component Value Date   WBC 9.5 08/28/2012   HGB 13.6 08/28/2012   HCT 39.3 08/28/2012   MCV 90.3 08/28/2012   PLT 224  08/28/2012   Lab Results  Component Value Date   NA 140 12/05/2018   K 4.0 12/05/2018   CO2 27 12/05/2018   GLUCOSE 101 (H) 12/05/2018   BUN 16 12/05/2018   CREATININE 0.91 12/05/2018   BILITOT 0.9 12/05/2018   ALKPHOS 65 10/01/2015   AST 28 12/05/2018   ALT 26 12/05/2018   PROT 6.9 12/05/2018   ALBUMIN 4.7 10/01/2015   CALCIUM 9.9 12/05/2018   Lab Results  Component Value Date   CHOL 184 12/05/2018   Lab Results  Component Value Date   HDL 52 12/05/2018   Lab Results  Component Value Date   LDLCALC 116 (H) 12/05/2018   Lab Results  Component Value Date   TRIG 72 12/05/2018   Lab Results  Component Value Date   CHOLHDL 3.5 12/05/2018   Lab Results  Component Value Date   HGBA1C 5.2 12/05/2018      Assessment & Plan:   Problem List Items Addressed This Visit      Cardiovascular and Mediastinum   HYPERTENSION - Primary    Control is fair.  Would really like to see his systolic pressure in the [id number] to low 130s.  Going to adjust his Diovan HCTZ dose.  Can follow-up in 2 to 3 weeks with nurse visit.. Continue current regimen. Follow up in  6 mo with me..       Relevant Orders   BASIC METABOLIC PANEL WITH GFR   Hemoglobin A1c   PSA     Endocrine   IMPAIRED FASTING GLUCOSE    Due for A1C.        Relevant Orders   BASIC METABOLIC PANEL WITH GFR   Hemoglobin A1c   PSA    Other Visit Diagnoses    Screening for prostate cancer       Relevant Orders   PSA   Need for Tdap vaccination          He and his wife have an appointment on Saturday to get their first Covid vaccine.  So encouraged him to get his tetanus vaccine as well as shingles vaccine at the pharmacy.  It is a covered benefit with his Medicare part D.  Meds ordered this encounter  Medications  . DISCONTD: valsartan-hydrochlorothiazide (DIOVAN HCT) 320-25 MG tablet    Sig: Take 1 tablet by mouth daily.    Dispense:  30 tablet    Refill:  0    Please discontinue Diovan 320/12.5     Follow-up: Return in about 6 months (around 12/06/2019).   Time spent 21 minutes in encounter.   Beatrice Lecher, MD

## 2019-06-05 NOTE — Addendum Note (Signed)
Addended by: Teddy Spike on: 06/05/2019 12:29 PM   Modules accepted: Orders

## 2019-06-06 LAB — BASIC METABOLIC PANEL WITH GFR
BUN: 14 mg/dL (ref 7–25)
CO2: 28 mmol/L (ref 20–32)
Calcium: 9.6 mg/dL (ref 8.6–10.3)
Chloride: 106 mmol/L (ref 98–110)
Creat: 0.88 mg/dL (ref 0.70–1.25)
GFR, Est African American: 103 mL/min/{1.73_m2} (ref >=60)
GFR, Est Non African American: 89 mL/min/{1.73_m2} (ref >=60)
Glucose, Bld: 104 mg/dL (ref 65–139)
Potassium: 4.2 mmol/L (ref 3.5–5.3)
Sodium: 140 mmol/L (ref 135–146)

## 2019-06-06 LAB — HEMOGLOBIN A1C
Hgb A1c MFr Bld: 5.2 % of total Hgb (ref <5.7)
Mean Plasma Glucose: 103 (calc)
eAG (mmol/L): 5.7 (calc)

## 2019-06-06 LAB — PSA: PSA: 0.9 ng/mL (ref <=4.0)

## 2019-06-28 ENCOUNTER — Ambulatory Visit (INDEPENDENT_AMBULATORY_CARE_PROVIDER_SITE_OTHER): Payer: Medicare Other | Admitting: Family Medicine

## 2019-06-28 ENCOUNTER — Other Ambulatory Visit: Payer: Self-pay

## 2019-06-28 VITALS — BP 135/79 | HR 72

## 2019-06-28 DIAGNOSIS — I10 Essential (primary) hypertension: Secondary | ICD-10-CM

## 2019-06-28 MED ORDER — VALSARTAN-HYDROCHLOROTHIAZIDE 320-25 MG PO TABS: 1.0000 | ORAL_TABLET | Freq: Every day | ORAL | 1 refills | Status: DC

## 2019-06-28 NOTE — Progress Notes (Signed)
Agree with documentation as above.   Angel Hechler, MD  

## 2019-06-28 NOTE — Progress Notes (Signed)
Patient is here for blood pressure check. Denies any new problems since last visit. He did increase his Diovan as directed to 320-25 mg once daily. He has not missed any doses. He doesn't check his blood pressures at home.   His first reading in the office today is 169/82 with HR 75. He sat for 10 minutes and his second reading is 135/79 with HR 72.   Patient advised to continue medication and keep follow up appt. Will call with any issues. Refills sent for 90 day supply to the pharmacy.

## 2019-06-28 NOTE — Patient Instructions (Signed)
Continue medication, keep follow up appt

## 2019-12-06 ENCOUNTER — Ambulatory Visit (INDEPENDENT_AMBULATORY_CARE_PROVIDER_SITE_OTHER): Payer: Medicare Other | Admitting: Family Medicine

## 2019-12-06 ENCOUNTER — Encounter: Payer: Self-pay | Admitting: Family Medicine

## 2019-12-06 VITALS — BP 130/79 | HR 83 | Ht 75.0 in | Wt 187.0 lb

## 2019-12-06 DIAGNOSIS — J302 Other seasonal allergic rhinitis: Secondary | ICD-10-CM | POA: Diagnosis not present

## 2019-12-06 DIAGNOSIS — R7301 Impaired fasting glucose: Secondary | ICD-10-CM | POA: Diagnosis not present

## 2019-12-06 DIAGNOSIS — Z23 Encounter for immunization: Secondary | ICD-10-CM | POA: Diagnosis not present

## 2019-12-06 DIAGNOSIS — I10 Essential (primary) hypertension: Secondary | ICD-10-CM

## 2019-12-06 LAB — COMPLETE METABOLIC PANEL WITH GFR
AG Ratio: 1.8 (calc) (ref 1.0–2.5)
ALT: 21 U/L (ref 9–46)
AST: 29 U/L (ref 10–35)
Albumin: 4.3 g/dL (ref 3.6–5.1)
Alkaline phosphatase (APISO): 62 U/L (ref 35–144)
BUN: 16 mg/dL (ref 7–25)
CO2: 27 mmol/L (ref 20–32)
Calcium: 9.5 mg/dL (ref 8.6–10.3)
Chloride: 106 mmol/L (ref 98–110)
Creat: 0.89 mg/dL (ref 0.70–1.25)
GFR, Est African American: 102 mL/min/{1.73_m2} (ref >=60)
GFR, Est Non African American: 88 mL/min/{1.73_m2} (ref >=60)
Globulin: 2.4 g/dL (calc) (ref 1.9–3.7)
Glucose, Bld: 93 mg/dL (ref 65–99)
Potassium: 3.9 mmol/L (ref 3.5–5.3)
Sodium: 140 mmol/L (ref 135–146)
Total Bilirubin: 0.8 mg/dL (ref 0.2–1.2)
Total Protein: 6.7 g/dL (ref 6.1–8.1)

## 2019-12-06 LAB — LIPID PANEL
Cholesterol: 181 mg/dL (ref <200)
HDL: 53 mg/dL (ref >=40)
LDL Cholesterol (Calc): 112 mg/dL (calc) — ABNORMAL HIGH
Non-HDL Cholesterol (Calc): 128 mg/dL (calc) (ref <130)
Total CHOL/HDL Ratio: 3.4 (calc) (ref <5.0)
Triglycerides: 70 mg/dL (ref <150)

## 2019-12-06 LAB — CBC
HCT: 44.6 % (ref 38.5–50.0)
Hemoglobin: 15.3 g/dL (ref 13.2–17.1)
MCH: 32.8 pg (ref 27.0–33.0)
MCHC: 34.3 g/dL (ref 32.0–36.0)
MCV: 95.7 fL (ref 80.0–100.0)
MPV: 11.1 fL (ref 7.5–12.5)
Platelets: 206 10*3/uL (ref 140–400)
RBC: 4.66 10*6/uL (ref 4.20–5.80)
RDW: 12.9 % (ref 11.0–15.0)
WBC: 5.9 10*3/uL (ref 3.8–10.8)

## 2019-12-06 LAB — POCT GLYCOSYLATED HEMOGLOBIN (HGB A1C): Hemoglobin A1C: 5.3 % (ref 4.0–5.6)

## 2019-12-06 MED ORDER — VALSARTAN-HYDROCHLOROTHIAZIDE 320-25 MG PO TABS: 1.0000 | ORAL_TABLET | Freq: Every day | ORAL | 1 refills | Status: DC

## 2019-12-06 NOTE — Assessment & Plan Note (Signed)
Well controlled. Continue current regimen. Follow up in  6 months. Due for labs.   

## 2019-12-06 NOTE — Progress Notes (Signed)
Established Patient Office Visit  Subjective:  Patient ID: Angel Flores, male    DOB: 08/18/1950  Age: 69 y.o. MRN: [id number]  CC:  Chief Complaint  Patient presents with  . Hypertension  . ifg    HPI Angel Flores presents for   Hypertension- Pt denies chest pain, SOB, dizziness, or heart palpitations.  Taking meds as directed w/o problems.  Denies medication side effects.    Impaired fasting glucose-no increased thirst or urination. No symptoms consistent with hypoglycemia.  He is also concerned because usually in October he develops a sinus infection he wants to know what would be safe to take if he needs to take something because of his high blood pressure.  Says he works outdoors a lot in the fall and really feels like that is what triggers it.  Past Medical History:  Diagnosis Date  . Facial injury    truamatic/both sinuses shattered  . Hypertension   . Psoriasis     Past Surgical History:  Procedure Laterality Date  . NO PAST SURGERIES      Family History  Problem Relation Age of Onset  . Breast cancer Mother   . Heart failure Mother   . Dementia Mother     Social History   Socioeconomic History  . Marital status: Married    Spouse name: Not on file  . Number of children: Not on file  . Years of education: Not on file  . Highest education level: Not on file  Occupational History  . Not on file  Tobacco Use  . Smoking status: Never Smoker  . Smokeless tobacco: Never Used  Substance and Sexual Activity  . Alcohol use: Yes  . Drug use: No  . Sexual activity: Yes  Other Topics Concern  . Not on file  Social History Narrative  . Not on file   Social Determinants of Health   Financial Resource Strain:   . Difficulty of Paying Living Expenses: Not on file  Food Insecurity:   . Worried About Charity fundraiser in the Last Year: Not on file  . Ran Out of Food in the Last Year: Not on file  Transportation Needs:   . Lack of Transportation  (Medical): Not on file  . Lack of Transportation (Non-Medical): Not on file  Physical Activity:   . Days of Exercise per Week: Not on file  . Minutes of Exercise per Session: Not on file  Stress:   . Feeling of Stress : Not on file  Social Connections:   . Frequency of Communication with Friends and Family: Not on file  . Frequency of Social Gatherings with Friends and Family: Not on file  . Attends Religious Services: Not on file  . Active Member of Clubs or Organizations: Not on file  . Attends Archivist Meetings: Not on file  . Marital Status: Not on file  Intimate Partner Violence:   . Fear of Current or Ex-Partner: Not on file  . Emotionally Abused: Not on file  . Physically Abused: Not on file  . Sexually Abused: Not on file    Outpatient Medications Prior to Visit  Medication Sig Dispense Refill  . augmented betamethasone dipropionate (DIPROLENE-AF) 0.05 % cream APP EXT AA BID    . valsartan-hydrochlorothiazide (DIOVAN HCT) 320-25 MG tablet Take 1 tablet by mouth daily. 90 tablet 1   No facility-administered medications prior to visit.    No Known Allergies  ROS Review of Systems  Objective:    Physical Exam Constitutional:      Appearance: He is well-developed.  HENT:     Head: Normocephalic and atraumatic.  Cardiovascular:     Rate and Rhythm: Normal rate and regular rhythm.     Heart sounds: Normal heart sounds.  Pulmonary:     Effort: Pulmonary effort is normal.     Breath sounds: Normal breath sounds.  Skin:    General: Skin is warm and dry.  Neurological:     Mental Status: He is alert and oriented to person, place, and time.  Psychiatric:        Behavior: Behavior normal.     BP 130/79   Pulse 83   Ht 6\' 3"  (1.905 m)   Wt 187 lb (84.8 kg)   SpO2 98%   BMI 23.37 kg/m  Wt Readings from Last 3 Encounters:  12/06/19 187 lb (84.8 kg)  06/05/19 188 lb (85.3 kg)  12/05/18 190 lb (86.2 kg)     There are no preventive care  reminders to display for this patient.  There are no preventive care reminders to display for this patient.  Lab Results  Component Value Date   TSH 2.999 01/10/2009   Lab Results  Component Value Date   WBC 9.5 08/28/2012   HGB 13.6 08/28/2012   HCT 39.3 08/28/2012   MCV 90.3 08/28/2012   PLT 224 08/28/2012   Lab Results  Component Value Date   NA 140 06/05/2019   K 4.2 06/05/2019   CO2 28 06/05/2019   GLUCOSE 104 06/05/2019   BUN 14 06/05/2019   CREATININE 0.88 06/05/2019   BILITOT 0.9 12/05/2018   ALKPHOS 65 10/01/2015   AST 28 12/05/2018   ALT 26 12/05/2018   PROT 6.9 12/05/2018   ALBUMIN 4.7 10/01/2015   CALCIUM 9.6 06/05/2019   Lab Results  Component Value Date   CHOL 184 12/05/2018   Lab Results  Component Value Date   HDL 52 12/05/2018   Lab Results  Component Value Date   LDLCALC 116 (H) 12/05/2018   Lab Results  Component Value Date   TRIG 72 12/05/2018   Lab Results  Component Value Date   CHOLHDL 3.5 12/05/2018   Lab Results  Component Value Date   HGBA1C 5.3 12/06/2019      Assessment & Plan:   Problem List Items Addressed This Visit      Cardiovascular and Mediastinum   HYPERTENSION - Primary    Well controlled. Continue current regimen. Follow up in  6 months. Due for labs      Relevant Medications   valsartan-hydrochlorothiazide (DIOVAN HCT) 320-25 MG tablet   Other Relevant Orders   CBC   COMPLETE METABOLIC PANEL WITH GFR   Lipid panel     Endocrine   IMPAIRED FASTING GLUCOSE    Well controlled. Continue current regimen. Follow up in  6 mo      Relevant Orders   POCT glycosylated hemoglobin (Hb A1C) (Completed)    Other Visit Diagnoses    Need for immunization against influenza       Relevant Orders   Flu Vaccine QUAD High Dose(Fluad) (Completed)   Seasonal allergic rhinitis, unspecified trigger          Seasonal allergies usually associated with a sinus infection.  We discussed that there really great studies  looking at using a nasal steroid spray preventatively during the season and that it actually does reduce acute sinusitis infections.  Recommend trial of Flonase  or Nasonex.  Okay to also use antihistamines without the decongestant.  Meds ordered this encounter  Medications  . valsartan-hydrochlorothiazide (DIOVAN HCT) 320-25 MG tablet    Sig: Take 1 tablet by mouth daily.    Dispense:  90 tablet    Refill:  1    Follow-up: Return in about 6 months (around 06/04/2020) for Hypertension.    Beatrice Lecher, MD

## 2019-12-06 NOTE — Patient Instructions (Signed)
Commend starting a trial of Flonase or Nasonex.  1 squirt in each nostril daily.  Make sure to tilt the chin forward and spray towards the corner of the eye.

## 2019-12-06 NOTE — Assessment & Plan Note (Signed)
Well controlled. Continue current regimen. Follow up in  6 mo  

## 2020-01-29 IMAGING — DX DG HAND COMPLETE 3+V*R*
3 series · 3 of 3 positions shown · non-contrast
Comparison: None.

CLINICAL DATA: Right dorsal hand pain, swelling, and redness.

EXAM:
RIGHT HAND - COMPLETE 3+ VIEW

[hand pa]
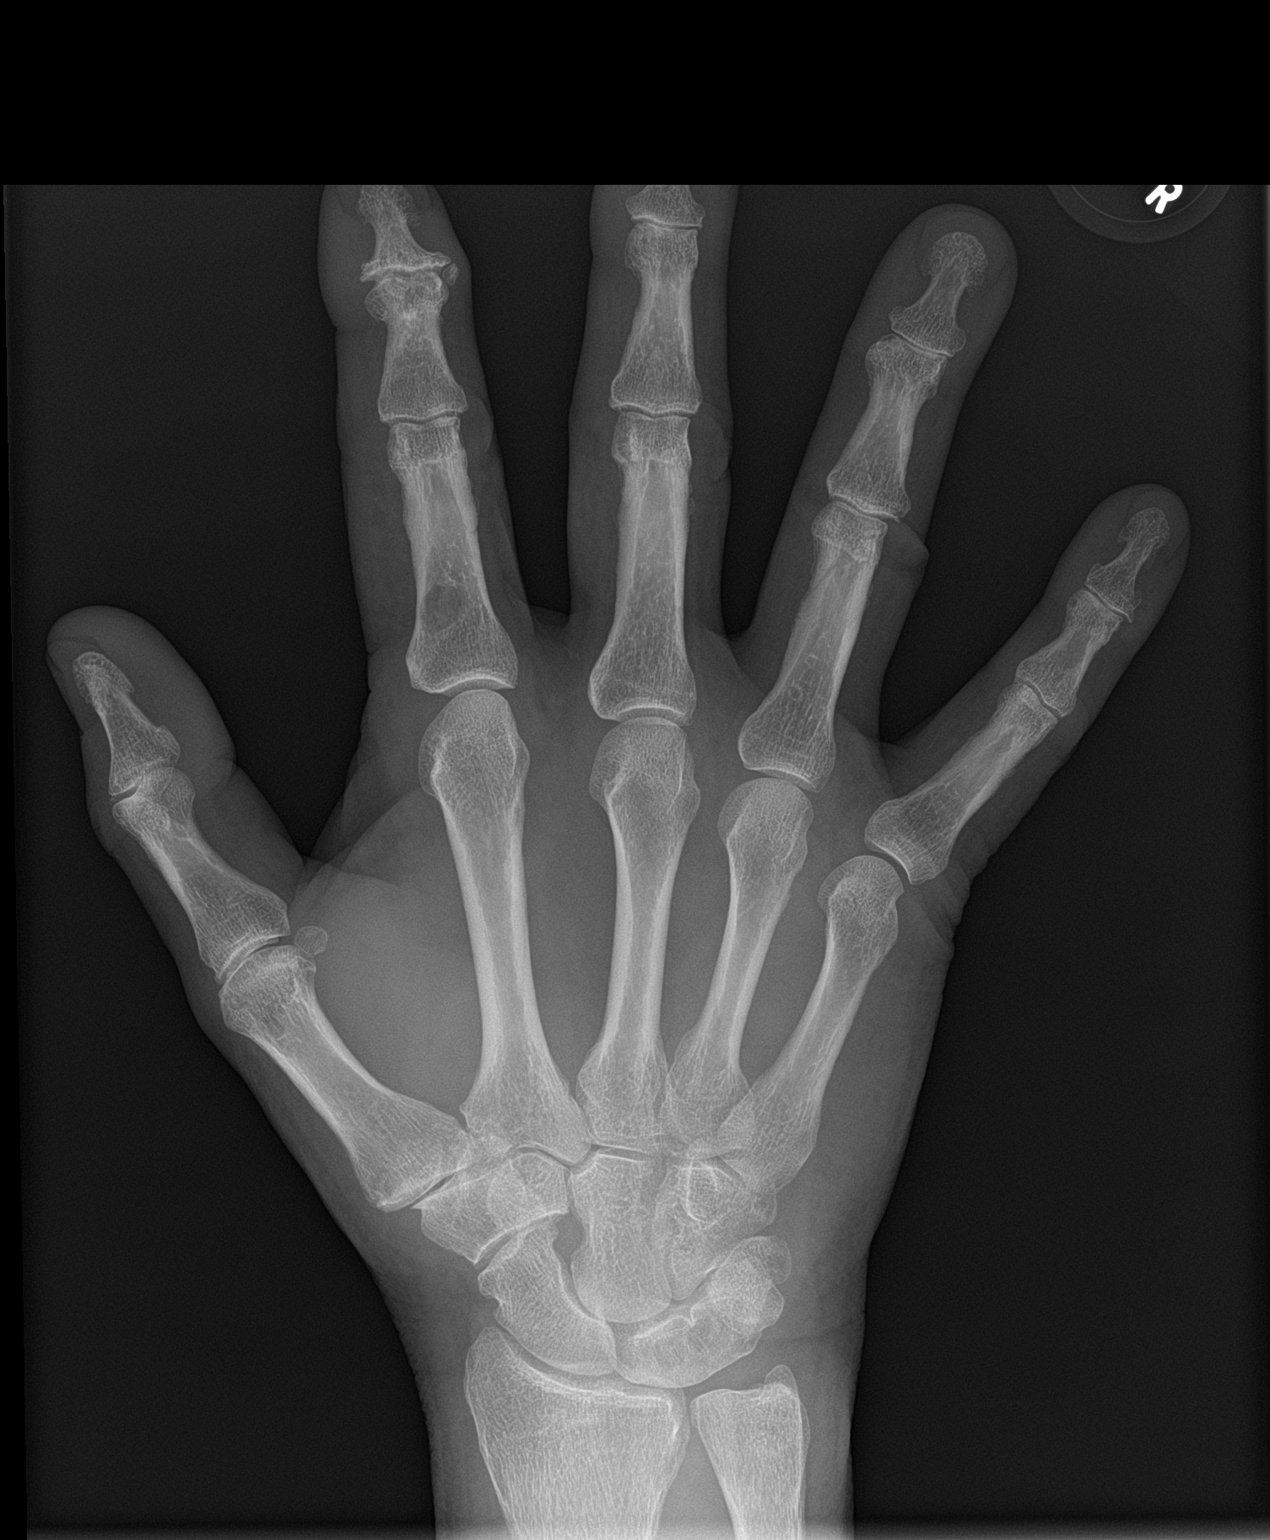

[hand obl]
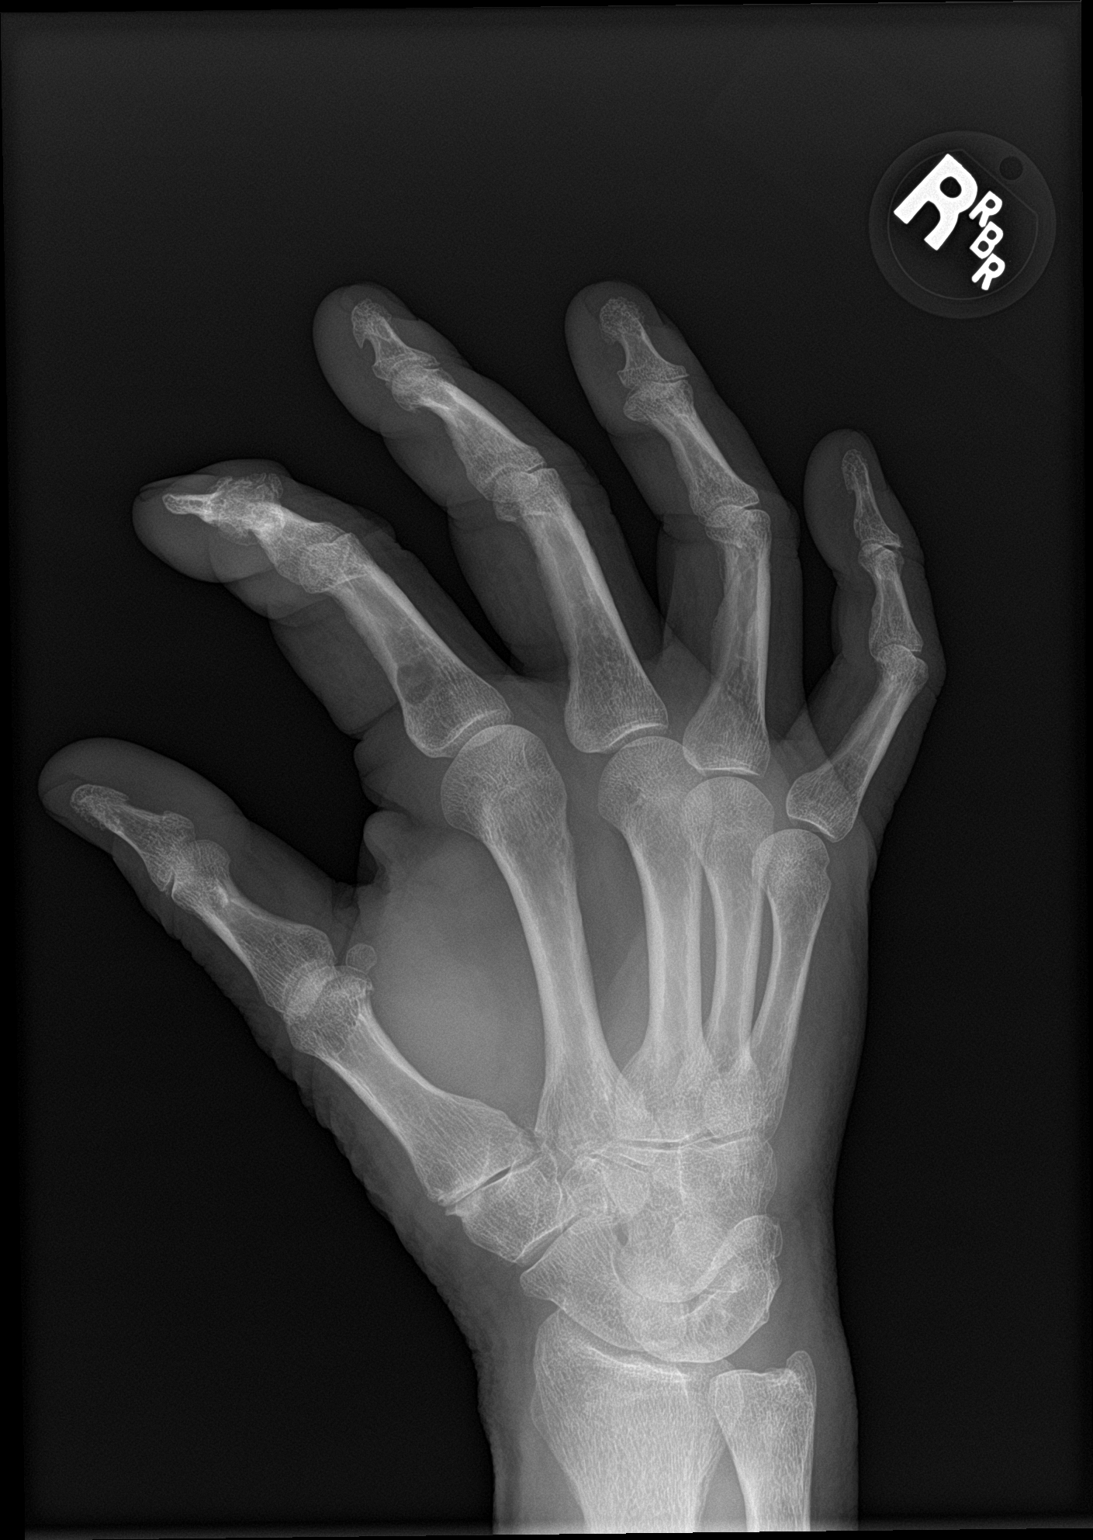

[hand lat]
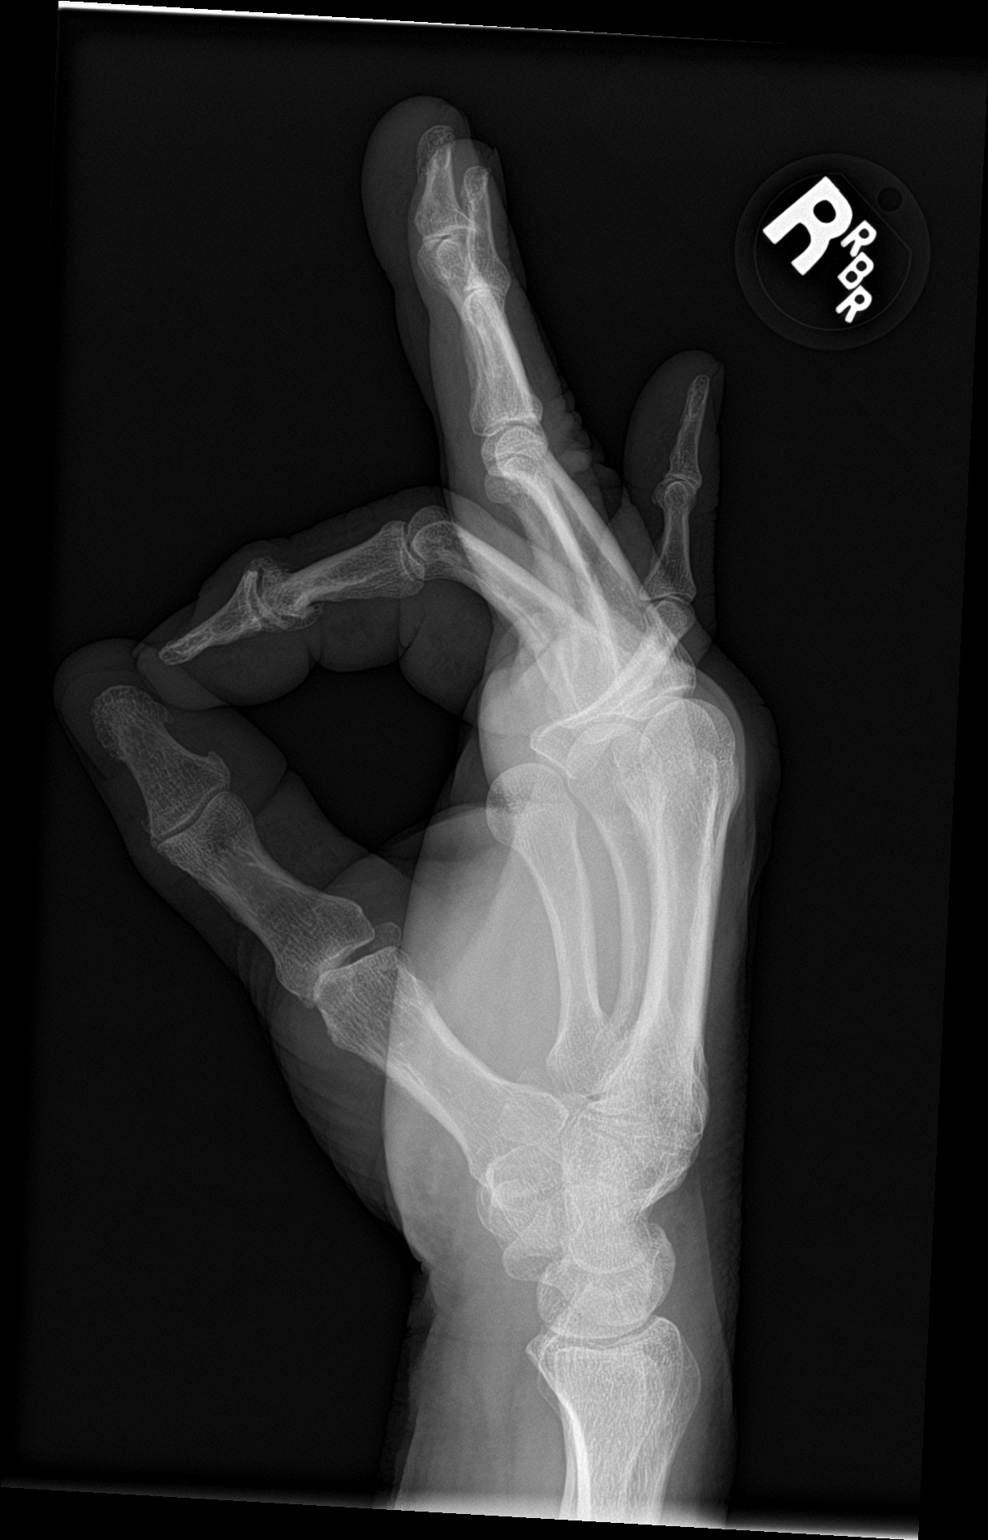

[3 of 3 positions shown; findings below may reference images not displayed]

FINDINGS: No acute fracture or dislocation. Moderate osteoarthritis of the
index finger DIP joint. Mild osteoarthritis of the first CMC and
scaphotrapeziotrapezoid joints. Incidental note is made of a
lunotriquetral coalition. Bone mineralization is normal. There is an
8 mm well-defined, nonaggressive appearing lucent lesion at the base
of the index finger proximal phalanx with mild scalloping of the
radial cortex, possibly a small enchondroma. No periosteal reaction.
Soft tissues are unremarkable.
IMPRESSION: 1.  No acute osseous abnormality.
2. Moderate osteoarthritis of the index finger DIP joint.

## 2020-06-04 ENCOUNTER — Encounter: Payer: Self-pay | Admitting: Family Medicine

## 2020-06-04 ENCOUNTER — Other Ambulatory Visit: Payer: Self-pay

## 2020-06-04 ENCOUNTER — Telehealth: Payer: Self-pay | Admitting: Family Medicine

## 2020-06-04 ENCOUNTER — Ambulatory Visit (INDEPENDENT_AMBULATORY_CARE_PROVIDER_SITE_OTHER): Payer: Medicare Other | Admitting: Family Medicine

## 2020-06-04 VITALS — BP 136/66 | HR 86 | Ht 75.0 in | Wt 188.0 lb

## 2020-06-04 DIAGNOSIS — Z125 Encounter for screening for malignant neoplasm of prostate: Secondary | ICD-10-CM

## 2020-06-04 DIAGNOSIS — R7301 Impaired fasting glucose: Secondary | ICD-10-CM | POA: Diagnosis not present

## 2020-06-04 DIAGNOSIS — I10 Essential (primary) hypertension: Secondary | ICD-10-CM | POA: Diagnosis not present

## 2020-06-04 LAB — POCT GLYCOSYLATED HEMOGLOBIN (HGB A1C): Hemoglobin A1C: 5.2 % (ref 4.0–5.6)

## 2020-06-04 MED ORDER — VALSARTAN-HYDROCHLOROTHIAZIDE 320-25 MG PO TABS: 1.0000 | ORAL_TABLET | Freq: Every day | ORAL | 1 refills | Status: DC

## 2020-06-04 NOTE — Telephone Encounter (Signed)
Please call for records [facility].  We had referred him there in 2020 for positive Cologuard and we have not received any results from their office.

## 2020-06-04 NOTE — Progress Notes (Signed)
Established Patient Office Visit  Subjective:  Patient ID: Angel Flores, male    DOB: 15-Mar-1951  Age: 70 y.o. MRN: [id number]  CC:  Chief Complaint  Patient presents with  . Hypertension  . ifg    HPI Angel Flores presents for   Hypertension- Pt denies chest pain, SOB, dizziness, or heart palpitations.  Taking meds as directed w/o problems.  Denies medication side effects.  Checks blood pressure at home occasionally and typically gets 130s over mid to upper 70s.  Impaired fasting glucose-no increased thirst or urination. No symptoms consistent with hypoglycemia.  Tried to get his Shingrix in 2020 but not covered by insurance at the time.    Past Medical History:  Diagnosis Date  . Facial injury    truamatic/both sinuses shattered  . Hypertension   . Psoriasis     Past Surgical History:  Procedure Laterality Date  . NO PAST SURGERIES      Family History  Problem Relation Age of Onset  . Breast cancer Mother   . Heart failure Mother   . Dementia Mother     Social History   Socioeconomic History  . Marital status: Married    Spouse name: Not on file  . Number of children: Not on file  . Years of education: Not on file  . Highest education level: Not on file  Occupational History  . Not on file  Tobacco Use  . Smoking status: Never Smoker  . Smokeless tobacco: Never Used  Substance and Sexual Activity  . Alcohol use: Yes  . Drug use: No  . Sexual activity: Yes  Other Topics Concern  . Not on file  Social History Narrative  . Not on file   Social Determinants of Health   Financial Resource Strain: Not on file  Food Insecurity: Not on file  Transportation Needs: Not on file  Physical Activity: Not on file  Stress: Not on file  Social Connections: Not on file  Intimate Partner Violence: Not on file    Outpatient Medications Prior to Visit  Medication Sig Dispense Refill  . augmented betamethasone dipropionate (DIPROLENE-AF) 0.05 % cream APP  EXT AA BID    . valsartan-hydrochlorothiazide (DIOVAN HCT) 320-25 MG tablet Take 1 tablet by mouth daily. 90 tablet 1   No facility-administered medications prior to visit.    No Known Allergies  ROS Review of Systems    Objective:    Physical Exam Constitutional:      Appearance: He is well-developed and well-nourished.  HENT:     Head: Normocephalic and atraumatic.  Neck:     Comments: No carotid bruits Cardiovascular:     Rate and Rhythm: Normal rate and regular rhythm.     Heart sounds: Normal heart sounds.  Pulmonary:     Effort: Pulmonary effort is normal.     Breath sounds: Normal breath sounds.  Skin:    General: Skin is warm and dry.  Neurological:     Mental Status: He is alert and oriented to person, place, and time.  Psychiatric:        Mood and Affect: Mood and affect normal.        Behavior: Behavior normal.     BP 136/66   Pulse 86   Ht 6\' 3"  (1.905 m)   Wt 188 lb (85.3 kg)   SpO2 100%   BMI 23.50 kg/m  Wt Readings from Last 3 Encounters:  06/04/20 188 lb (85.3 kg)  12/06/19 187 lb (  84.8 kg)  06/05/19 188 lb (85.3 kg)     Health Maintenance Due  Topic Date Due  . COVID-19 Vaccine (2 - Booster for Janssen series) 08/04/2019    There are no preventive care reminders to display for this patient.  Lab Results  Component Value Date   TSH 2.999 01/10/2009   Lab Results  Component Value Date   WBC 5.9 12/06/2019   HGB 15.3 12/06/2019   HCT 44.6 12/06/2019   MCV 95.7 12/06/2019   PLT 206 12/06/2019   Lab Results  Component Value Date   NA 140 12/06/2019   K 3.9 12/06/2019   CO2 27 12/06/2019   GLUCOSE 93 12/06/2019   BUN 16 12/06/2019   CREATININE 0.89 12/06/2019   BILITOT 0.8 12/06/2019   ALKPHOS 65 10/01/2015   AST 29 12/06/2019   ALT 21 12/06/2019   PROT 6.7 12/06/2019   ALBUMIN 4.7 10/01/2015   CALCIUM 9.5 12/06/2019   Lab Results  Component Value Date   CHOL 181 12/06/2019   Lab Results  Component Value Date   HDL  53 12/06/2019   Lab Results  Component Value Date   LDLCALC 112 (H) 12/06/2019   Lab Results  Component Value Date   TRIG 70 12/06/2019   Lab Results  Component Value Date   CHOLHDL 3.4 12/06/2019   Lab Results  Component Value Date   HGBA1C 5.2 06/04/2020      Assessment & Plan:   Problem List Items Addressed This Visit      Cardiovascular and Mediastinum   HYPERTENSION - Primary    Well controlled. Continue current regimen. Follow up in  6 mo       Relevant Medications   valsartan-hydrochlorothiazide (DIOVAN HCT) 320-25 MG tablet   Other Relevant Orders   BASIC METABOLIC PANEL WITH GFR     Endocrine   IMPAIRED FASTING GLUCOSE    Well controlled. Continue current regimen. Follow up in  6 mo       Relevant Orders   POCT glycosylated hemoglobin (Hb A1C) (Completed)   BASIC METABOLIC PANEL WITH GFR    Other Visit Diagnoses    Screening for prostate cancer         Due for prostate cancer screening.  Did encourage him to try to get the shingles vaccine again it should be covered by Medicare though he may have a drug deductible which could significantly impact the cost.  Will for records from [place] from 2020 referral for pos Cologuard.   Meds ordered this encounter  Medications  . valsartan-hydrochlorothiazide (DIOVAN HCT) 320-25 MG tablet    Sig: Take 1 tablet by mouth daily.    Dispense:  90 tablet    Refill:  1    Follow-up: Return in about 6 months (around 12/05/2020) for Hypertension.    Beatrice Lecher, MD

## 2020-06-04 NOTE — Assessment & Plan Note (Signed)
Well controlled. Continue current regimen. Follow up in  6 mo  

## 2020-06-05 LAB — BASIC METABOLIC PANEL WITH GFR
BUN: 17 mg/dL (ref 7–25)
CO2: 25 mmol/L (ref 20–32)
Calcium: 10.1 mg/dL (ref 8.6–10.3)
Chloride: 106 mmol/L (ref 98–110)
Creat: 0.91 mg/dL (ref 0.70–1.25)
GFR, Est African American: 100 mL/min/{1.73_m2} (ref >=60)
GFR, Est Non African American: 86 mL/min/{1.73_m2} (ref >=60)
Glucose, Bld: 87 mg/dL (ref 65–99)
Potassium: 4.2 mmol/L (ref 3.5–5.3)
Sodium: 142 mmol/L (ref 135–146)

## 2020-06-05 NOTE — Progress Notes (Signed)
All labs are normal. 

## 2020-06-05 NOTE — Telephone Encounter (Signed)
Called [facility] to request records, also faxed a request for records.

## 2020-06-10 NOTE — Telephone Encounter (Signed)
Called pt no vm has been set up for this number.

## 2020-10-07 ENCOUNTER — Ambulatory Visit (INDEPENDENT_AMBULATORY_CARE_PROVIDER_SITE_OTHER): Payer: Medicare Other | Admitting: Family Medicine

## 2020-10-07 DIAGNOSIS — Z0000 Encounter for general adult medical examination without abnormal findings: Secondary | ICD-10-CM | POA: Diagnosis not present
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
## 2020-10-07 NOTE — Patient Instructions (Addendum)
[place] Maintenance Summary and Written Plan of Care  Angel Flores ,  Thank you for allowing me to perform your Medicare Annual Wellness Visit and for your ongoing commitment to your health.   Health Maintenance & Immunization History Health Maintenance  Topic Date Due   COVID-19 Vaccine (3 - Booster for Janssen series) 10/23/2020 (Originally 07/03/2020)   Zoster Vaccines- Shingrix (1 of 2) 01/07/2021 (Originally 11/16/2001)   INFLUENZA VACCINE  10/27/2020   Fecal DNA (Cologuard)  12/12/2021   TETANUS/TDAP  05/27/2028   Hepatitis C Screening  Completed   PNA vac Low Risk Adult  Completed   HPV VACCINES  Aged Out   Immunization History  Administered Date(s) Administered   Fluad Quad(high Dose 65+) 12/05/2018, 12/06/2019   Influenza Whole 12/27/2008   Influenza, Seasonal, Injecte, Preservative Fre 12/29/2012   Influenza,inj,Quad PF,6+ Mos 01/11/2016   Influenza-Unspecified 02/11/2018   Janssen (J&J) SARS-COV-2 Vaccination 06/09/2019, 03/04/2020   Pneumococcal Conjugate-13 12/09/2016   Pneumococcal Polysaccharide-23 12/06/2017   Td 05/30/2009   Tdap 05/28/2018   Zoster, Live 04/24/2015    These are the patient goals that we discussed:  Goals Addressed               This Visit's Progress     Patient Stated (pt-stated)        10/07/2020 AWV Goal: Exercise for General Health  Patient will verbalize understanding of the benefits of increased physical activity: Exercising regularly is important. It will improve your overall fitness, flexibility, and endurance. Regular exercise also will improve your overall health. It can help you control your weight, reduce stress, and improve your bone density. Over the next year, patient will increase physical activity as tolerated with a goal of at least 150 minutes of moderate physical activity per week.  You can tell that you are exercising at a moderate intensity if your heart starts beating faster and you  start breathing faster but can still hold a conversation. Moderate-intensity exercise ideas include: Walking 1 mile (1.6 km) in about 15 minutes Biking Hiking Golfing Dancing Water aerobics Patient will verbalize understanding of everyday activities that increase physical activity by providing examples like the following: Yard work, such as: Sales promotion account executive Gardening Washing windows or floors Patient will be able to explain general safety guidelines for exercising:  Before you start a new exercise program, talk with your health care provider. Do not exercise so much that you hurt yourself, feel dizzy, or get very short of breath. Wear comfortable clothes and wear shoes with good support. Drink plenty of water while you exercise to prevent dehydration or heat stroke. Work out until your breathing and your heartbeat get faster.           This is a list of Health Maintenance Items that are overdue or due now: Shingles vaccine Eye exam Colonoscopy  Orders/Referrals Placed Today: No orders of the defined types were placed in this encounter.  (Contact our referral department at [phone] if you have not spoken with someone about your referral appointment within the next 5 days)    Follow-up Plan Follow-up with Hali Marry, MD as planned Schedule your shingles vaccine at your pharmacy.  Medicare wellness visit in one year. AVS printed and mailed to the patient.   Health Maintenance, Male Adopting a healthy lifestyle and getting preventive care are important in promoting health and wellness. Ask your health care provider  about: The right schedule for you to have regular tests and exams. Things you can do on your own to prevent diseases and keep yourself healthy. What should I know about diet, weight, and exercise? Eat a healthy diet  Eat a diet that includes plenty of  vegetables, fruits, low-fat dairy products, and lean protein. Do not eat a lot of foods that are high in solid fats, added sugars, or sodium.  Maintain a healthy weight Body mass index (BMI) is a measurement that can be used to identify possible weight problems. It estimates body fat based on height and weight. Your health care provider can help determine your BMI and help you achieve or maintain ahealthy weight. Get regular exercise Get regular exercise. This is one of the most important things you can do for your health. Most adults should: Exercise for at least 150 minutes each week. The exercise should increase your heart rate and make you sweat (moderate-intensity exercise). Do strengthening exercises at least twice a week. This is in addition to the moderate-intensity exercise. Spend less time sitting. Even light physical activity can be beneficial. Watch cholesterol and blood lipids Have your blood tested for lipids and cholesterol at 69 years of age, then havethis test every 5 years. You may need to have your cholesterol levels checked more often if: Your lipid or cholesterol levels are high. You are older than 69 years of age. You are at high risk for heart disease. What should I know about cancer screening? Many types of cancers can be detected early and may often be prevented. Depending on your health history and family history, you may need to have cancer screening at various ages. This may include screening for: Colorectal cancer. Prostate cancer. Skin cancer. Lung cancer. What should I know about heart disease, diabetes, and high blood pressure? Blood pressure and heart disease High blood pressure causes heart disease and increases the risk of stroke. This is more likely to develop in people who have high blood pressure readings, are of African descent, or are overweight. Talk with your health care provider about your target blood pressure readings. Have your blood pressure  checked: Every 3-5 years if you are 45-55 years of age. Every year if you are 48 years old or older. If you are between the ages of 65 and 66 and are a current or former smoker, ask your health care provider if you should have a one-time screening for abdominal aortic aneurysm (AAA). Diabetes Have regular diabetes screenings. This checks your fasting blood sugar level. Have the screening done: Once every three years after age 80 if you are at a normal weight and have a low risk for diabetes. More often and at a younger age if you are overweight or have a high risk for diabetes. What should I know about preventing infection? Hepatitis B If you have a higher risk for hepatitis B, you should be screened for this virus. Talk with your health care provider to find out if you are at risk forhepatitis B infection. Hepatitis C Blood testing is recommended for: Everyone born from [birth year] through 1964. Anyone with known risk factors for hepatitis C. Sexually transmitted infections (STIs) You should be screened each year for STIs, including gonorrhea and chlamydia, if: You are sexually active and are younger than 69 years of age. You are older than 69 years of age and your health care provider tells you that you are at risk for this type of infection. Your sexual activity has changed  since you were last screened, and you are at increased risk for chlamydia or gonorrhea. Ask your health care provider if you are at risk. Ask your health care provider about whether you are at high risk for HIV. Your health care provider may recommend a prescription medicine to help prevent HIV infection. If you choose to take medicine to prevent HIV, you should first get tested for HIV. You should then be tested every 3 months for as long as you are taking the medicine. Follow these instructions at home: Lifestyle Do not use any products that contain nicotine or tobacco, such as cigarettes, e-cigarettes, and chewing tobacco.  If you need help quitting, ask your health care provider. Do not use street drugs. Do not share needles. Ask your health care provider for help if you need support or information about quitting drugs. Alcohol use Do not drink alcohol if your health care provider tells you not to drink. If you drink alcohol: Limit how much you have to 0-2 drinks a day. Be aware of how much alcohol is in your drink. In the [place] one drink equals one 12 oz bottle of beer (355 mL), one 5 oz glass of wine (148 mL), or one 1 oz glass of hard liquor (44 mL). General instructions Schedule regular health, dental, and eye exams. Stay current with your vaccines. Tell your health care provider if: You often feel depressed. You have ever been abused or do not feel safe at home. Summary Adopting a healthy lifestyle and getting preventive care are important in promoting health and wellness. Follow your health care provider's instructions about healthy diet, exercising, and getting tested or screened for diseases. Follow your health care provider's instructions on monitoring your cholesterol and blood pressure. This information is not intended to replace advice given to you by your health care provider. Make sure you discuss any questions you have with your healthcare provider. Document Revised: 03/08/2018 Document Reviewed: 03/08/2018 Elsevier Patient Education  2022 [employer].

## 2020-11-24 DIAGNOSIS — L570 Actinic keratosis: Secondary | ICD-10-CM | POA: Diagnosis not present

## 2020-11-24 DIAGNOSIS — Z85828 Personal history of other malignant neoplasm of skin: Secondary | ICD-10-CM | POA: Diagnosis not present

## 2020-11-24 DIAGNOSIS — L578 Other skin changes due to chronic exposure to nonionizing radiation: Secondary | ICD-10-CM | POA: Diagnosis not present

## 2020-11-24 DIAGNOSIS — D1801 Hemangioma of skin and subcutaneous tissue: Secondary | ICD-10-CM | POA: Diagnosis not present

## 2020-11-30 ENCOUNTER — Other Ambulatory Visit: Payer: Self-pay | Admitting: Family Medicine

## 2020-11-30 DIAGNOSIS — I10 Essential (primary) hypertension: Secondary | ICD-10-CM

## 2020-12-04 ENCOUNTER — Encounter: Payer: Self-pay | Admitting: Family Medicine

## 2020-12-04 ENCOUNTER — Ambulatory Visit (INDEPENDENT_AMBULATORY_CARE_PROVIDER_SITE_OTHER): Payer: Medicare Other | Admitting: Family Medicine

## 2020-12-04 VITALS — BP 130/78 | HR 85 | Ht 75.0 in | Wt 186.0 lb

## 2020-12-04 DIAGNOSIS — I10 Essential (primary) hypertension: Secondary | ICD-10-CM | POA: Diagnosis not present

## 2020-12-04 DIAGNOSIS — R062 Wheezing: Secondary | ICD-10-CM | POA: Diagnosis not present

## 2020-12-04 DIAGNOSIS — Z125 Encounter for screening for malignant neoplasm of prostate: Secondary | ICD-10-CM | POA: Diagnosis not present

## 2020-12-04 DIAGNOSIS — Z23 Encounter for immunization: Secondary | ICD-10-CM | POA: Diagnosis not present

## 2020-12-04 DIAGNOSIS — R7301 Impaired fasting glucose: Secondary | ICD-10-CM

## 2020-12-04 LAB — POCT GLYCOSYLATED HEMOGLOBIN (HGB A1C): Hemoglobin A1C: 5 % (ref 4.0–5.6)

## 2020-12-04 NOTE — Assessment & Plan Note (Signed)
Well controlled. Continue current regimen. Follow up in  12 mo  

## 2020-12-04 NOTE — Assessment & Plan Note (Signed)
Well controlled. Continue current regimen. Follow up in  6 mo  
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
## 2020-12-05 DIAGNOSIS — I10 Essential (primary) hypertension: Secondary | ICD-10-CM | POA: Diagnosis not present

## 2020-12-05 DIAGNOSIS — Z125 Encounter for screening for malignant neoplasm of prostate: Secondary | ICD-10-CM | POA: Diagnosis not present

## 2020-12-05 DIAGNOSIS — R7301 Impaired fasting glucose: Secondary | ICD-10-CM | POA: Diagnosis not present

## 2020-12-06 LAB — COMPLETE METABOLIC PANEL WITH GFR
AG Ratio: 2 (calc) (ref 1.0–2.5)
ALT: 23 U/L (ref 9–46)
AST: 30 U/L (ref 10–35)
Albumin: 4.5 g/dL (ref 3.6–5.1)
Alkaline phosphatase (APISO): 72 U/L (ref 35–144)
BUN: 16 mg/dL (ref 7–25)
CO2: 28 mmol/L (ref 20–32)
Calcium: 9.8 mg/dL (ref 8.6–10.3)
Chloride: 102 mmol/L (ref 98–110)
Creat: 0.96 mg/dL (ref 0.70–1.35)
Globulin: 2.3 g/dL (calc) (ref 1.9–3.7)
Glucose, Bld: 87 mg/dL (ref 65–99)
Potassium: 4.1 mmol/L (ref 3.5–5.3)
Sodium: 139 mmol/L (ref 135–146)
Total Bilirubin: 0.7 mg/dL (ref 0.2–1.2)
Total Protein: 6.8 g/dL (ref 6.1–8.1)
eGFR: 86 mL/min/{1.73_m2} (ref >=60)

## 2020-12-06 LAB — LIPID PANEL
Cholesterol: 170 mg/dL (ref <200)
HDL: 45 mg/dL (ref >=40)
LDL Cholesterol (Calc): 107 mg/dL (calc) — ABNORMAL HIGH
Non-HDL Cholesterol (Calc): 125 mg/dL (calc) (ref <130)
Total CHOL/HDL Ratio: 3.8 (calc) (ref <5.0)
Triglycerides: 88 mg/dL (ref <150)

## 2020-12-06 LAB — PSA: PSA: 0.82 ng/mL (ref <=4.00)

## 2020-12-08 NOTE — Progress Notes (Signed)
Call patient: Overall labs look good except LDL is just a little elevated.  It is better than last year so great work and bringing that down.

## 2021-05-29 ENCOUNTER — Other Ambulatory Visit: Payer: Self-pay | Admitting: Family Medicine

## 2021-05-29 DIAGNOSIS — I10 Essential (primary) hypertension: Secondary | ICD-10-CM

## 2021-06-03 ENCOUNTER — Ambulatory Visit (INDEPENDENT_AMBULATORY_CARE_PROVIDER_SITE_OTHER): Payer: Medicare HMO | Admitting: Family Medicine

## 2021-06-03 ENCOUNTER — Other Ambulatory Visit: Payer: Self-pay

## 2021-06-03 ENCOUNTER — Encounter: Payer: Self-pay | Admitting: Family Medicine

## 2021-06-03 VITALS — BP 136/74 | HR 81 | Resp 16 | Ht 75.0 in | Wt 184.0 lb

## 2021-06-03 DIAGNOSIS — R7301 Impaired fasting glucose: Secondary | ICD-10-CM

## 2021-06-03 DIAGNOSIS — I10 Essential (primary) hypertension: Secondary | ICD-10-CM | POA: Diagnosis not present

## 2021-06-03 DIAGNOSIS — J301 Allergic rhinitis due to pollen: Secondary | ICD-10-CM

## 2021-06-03 MED ORDER — VALSARTAN-HYDROCHLOROTHIAZIDE 320-25 MG PO TABS: 1.0000 | ORAL_TABLET | Freq: Every day | ORAL | 3 refills | Status: DC

## 2021-06-03 NOTE — Assessment & Plan Note (Signed)
Well controlled. Continue current regimen. Follow up in  6 mo  

## 2021-06-03 NOTE — Assessment & Plan Note (Signed)
Using flonase PRN.  ?
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
## 2021-06-03 NOTE — Assessment & Plan Note (Signed)
Due for A1C  

## 2021-06-04 LAB — BASIC METABOLIC PANEL WITH GFR
BUN: 15 mg/dL (ref 7–25)
CO2: 26 mmol/L (ref 20–32)
Calcium: 9.8 mg/dL (ref 8.6–10.3)
Chloride: 105 mmol/L (ref 98–110)
Creat: 0.99 mg/dL (ref 0.70–1.35)
Glucose, Bld: 104 mg/dL (ref 65–139)
Potassium: 4.2 mmol/L (ref 3.5–5.3)
Sodium: 142 mmol/L (ref 135–146)
eGFR: 82 mL/min/{1.73_m2} (ref >=60)

## 2021-06-04 LAB — HEMOGLOBIN A1C
Hgb A1c MFr Bld: 5.2 % of total Hgb (ref <5.7)
Mean Plasma Glucose: 103 mg/dL
eAG (mmol/L): 5.7 mmol/L

## 2021-06-04 NOTE — Progress Notes (Signed)
Your lab work is within acceptable range and there are no concerning findings.   ?

## 2021-07-20 DIAGNOSIS — H25813 Combined forms of age-related cataract, bilateral: Secondary | ICD-10-CM | POA: Diagnosis not present

## 2021-07-20 DIAGNOSIS — H524 Presbyopia: Secondary | ICD-10-CM | POA: Diagnosis not present

## 2021-07-20 DIAGNOSIS — D3131 Benign neoplasm of right choroid: Secondary | ICD-10-CM | POA: Diagnosis not present

## 2021-07-20 DIAGNOSIS — H43393 Other vitreous opacities, bilateral: Secondary | ICD-10-CM | POA: Diagnosis not present

## 2021-11-25 DIAGNOSIS — L821 Other seborrheic keratosis: Secondary | ICD-10-CM | POA: Diagnosis not present

## 2021-11-25 DIAGNOSIS — Z85828 Personal history of other malignant neoplasm of skin: Secondary | ICD-10-CM | POA: Diagnosis not present

## 2021-11-25 DIAGNOSIS — L570 Actinic keratosis: Secondary | ICD-10-CM | POA: Diagnosis not present

## 2021-11-25 DIAGNOSIS — L578 Other skin changes due to chronic exposure to nonionizing radiation: Secondary | ICD-10-CM | POA: Diagnosis not present

## 2021-11-25 DIAGNOSIS — D1801 Hemangioma of skin and subcutaneous tissue: Secondary | ICD-10-CM | POA: Diagnosis not present

## 2021-12-07 ENCOUNTER — Ambulatory Visit (INDEPENDENT_AMBULATORY_CARE_PROVIDER_SITE_OTHER): Payer: Medicare HMO | Admitting: Family Medicine

## 2021-12-07 ENCOUNTER — Encounter: Payer: Self-pay | Admitting: Family Medicine

## 2021-12-07 VITALS — BP 136/66 | HR 83 | Ht 75.0 in | Wt 181.0 lb

## 2021-12-07 DIAGNOSIS — I10 Essential (primary) hypertension: Secondary | ICD-10-CM | POA: Diagnosis not present

## 2021-12-07 DIAGNOSIS — Z974 Presence of external hearing-aid: Secondary | ICD-10-CM

## 2021-12-07 DIAGNOSIS — Z125 Encounter for screening for malignant neoplasm of prostate: Secondary | ICD-10-CM

## 2021-12-07 DIAGNOSIS — R7301 Impaired fasting glucose: Secondary | ICD-10-CM

## 2021-12-07 DIAGNOSIS — Z23 Encounter for immunization: Secondary | ICD-10-CM

## 2021-12-07 NOTE — Assessment & Plan Note (Signed)
Well controlled. Continue current regimen. Follow up in  6 mo  

## 2021-12-07 NOTE — Progress Notes (Signed)
   Established Patient Office Visit  Subjective   Patient ID: Angel Flores, male    DOB: 06-Jul-1950  Age: 71 y.o. MRN: [id number]  Chief Complaint  Patient presents with   Hypertension    HPI  Hypertension- Pt denies chest pain, SOB, dizziness, or heart palpitations.  Taking meds as directed w/o problems.  Denies medication side effects.  Blood pressures have been running in the 130s over 80s.  He now has hearing aids and has been doing well with them he is just try to get adjusted to a mostly wearing them in the evening.  He has an app where he can adjust the volume on them.  Just got new hearing aids.     ROS    Objective:     BP 136/66   Pulse 83   Ht '6\' 3"'$  (1.905 m)   Wt 181 lb (82.1 kg)   SpO2 98%   BMI 22.62 kg/m    Physical Exam Constitutional:      Appearance: He is well-developed.  HENT:     Head: Normocephalic and atraumatic.  Cardiovascular:     Rate and Rhythm: Normal rate and regular rhythm.     Heart sounds: Normal heart sounds.  Pulmonary:     Effort: Pulmonary effort is normal.     Breath sounds: Normal breath sounds.  Skin:    General: Skin is warm and dry.  Neurological:     Mental Status: He is alert and oriented to person, place, and time.  Psychiatric:        Behavior: Behavior normal.      No results found for any visits on 12/07/21.    The 10-year ASCVD risk score (Arnett DK, et al., 2019) is: 22.2%    Assessment & Plan:   Problem List Items Addressed This Visit       Cardiovascular and Mediastinum   HYPERTENSION - Primary    Well controlled. Continue current regimen. Follow up in  6 mo       Relevant Orders   PSA   COMPLETE METABOLIC PANEL WITH GFR   Lipid panel   HgB A1c     Endocrine   IMPAIRED FASTING GLUCOSE    Due for A1C       Relevant Orders   PSA   COMPLETE METABOLIC PANEL WITH GFR   Lipid panel   HgB A1c     Other   Wears hearing aid   Other Visit Diagnoses     Screening for prostate  cancer       Relevant Orders   PSA   COMPLETE METABOLIC PANEL WITH GFR   Lipid panel   HgB A1c   Need for immunization against influenza       Relevant Orders   Flu Vaccine QUAD High Dose(Fluad) (Completed)       Return in about 6 months (around 06/07/2022) for Hypertension.    Beatrice Lecher, MD

## 2021-12-07 NOTE — Assessment & Plan Note (Signed)
Due for A1C

## 2021-12-08 LAB — COMPLETE METABOLIC PANEL WITH GFR
AG Ratio: 1.6 (calc) (ref 1.0–2.5)
ALT: 24 U/L (ref 9–46)
AST: 28 U/L (ref 10–35)
Albumin: 4.6 g/dL (ref 3.6–5.1)
Alkaline phosphatase (APISO): 70 U/L (ref 35–144)
BUN: 15 mg/dL (ref 7–25)
CO2: 27 mmol/L (ref 20–32)
Calcium: 10 mg/dL (ref 8.6–10.3)
Chloride: 104 mmol/L (ref 98–110)
Creat: 0.92 mg/dL (ref 0.70–1.28)
Globulin: 2.8 g/dL (calc) (ref 1.9–3.7)
Glucose, Bld: 95 mg/dL (ref 65–99)
Potassium: 4.3 mmol/L (ref 3.5–5.3)
Sodium: 141 mmol/L (ref 135–146)
Total Bilirubin: 0.7 mg/dL (ref 0.2–1.2)
Total Protein: 7.4 g/dL (ref 6.1–8.1)
eGFR: 89 mL/min/{1.73_m2} (ref >=60)

## 2021-12-08 LAB — HEMOGLOBIN A1C
Hgb A1c MFr Bld: 5.1 % of total Hgb (ref <5.7)
Mean Plasma Glucose: 100 mg/dL
eAG (mmol/L): 5.5 mmol/L

## 2021-12-08 LAB — LIPID PANEL
Cholesterol: 179 mg/dL (ref <200)
HDL: 57 mg/dL (ref >=40)
LDL Cholesterol (Calc): 103 mg/dL (calc) — ABNORMAL HIGH
Non-HDL Cholesterol (Calc): 122 mg/dL (calc) (ref <130)
Total CHOL/HDL Ratio: 3.1 (calc) (ref <5.0)
Triglycerides: 91 mg/dL (ref <150)

## 2021-12-08 LAB — PSA: PSA: 1.05 ng/mL (ref <=4.00)

## 2021-12-08 NOTE — Progress Notes (Signed)
Call patient: LDL cholesterol is just borderline elevated at 103 goal is less than 100.  All other labs look great.

## 2022-03-10 DIAGNOSIS — G5601 Carpal tunnel syndrome, right upper limb: Secondary | ICD-10-CM | POA: Diagnosis not present

## 2022-03-19 ENCOUNTER — Encounter: Payer: Medicare HMO | Admitting: Sports Medicine

## 2022-04-07 DIAGNOSIS — G5601 Carpal tunnel syndrome, right upper limb: Secondary | ICD-10-CM | POA: Diagnosis not present

## 2022-05-24 ENCOUNTER — Telehealth: Payer: Self-pay | Admitting: Family Medicine

## 2022-05-24 NOTE — Telephone Encounter (Signed)
Called patient to schedule Medicare Annual Wellness Visit (AWV). Left message for patient to call back and schedule Medicare Annual Wellness Visit (AWV).  Last date of AWV: 10/07/20  Please schedule an appointment at any time with Nurse Health Advisor.  If any questions, please contact me at [phone].  Thank you ,  Lin Givens Patient Access Advocate II Direct Dial: [phone]

## 2022-06-07 ENCOUNTER — Ambulatory Visit (INDEPENDENT_AMBULATORY_CARE_PROVIDER_SITE_OTHER): Payer: Medicare HMO | Admitting: Family Medicine

## 2022-06-07 VITALS — BP 130/82 | HR 76 | Ht 75.0 in | Wt 185.0 lb

## 2022-06-07 DIAGNOSIS — I10 Essential (primary) hypertension: Secondary | ICD-10-CM

## 2022-06-07 DIAGNOSIS — Z974 Presence of external hearing-aid: Secondary | ICD-10-CM | POA: Diagnosis not present

## 2022-06-07 DIAGNOSIS — R195 Other fecal abnormalities: Secondary | ICD-10-CM | POA: Diagnosis not present

## 2022-06-07 DIAGNOSIS — R7301 Impaired fasting glucose: Secondary | ICD-10-CM

## 2022-06-07 DIAGNOSIS — G5601 Carpal tunnel syndrome, right upper limb: Secondary | ICD-10-CM

## 2022-06-07 LAB — POCT GLYCOSYLATED HEMOGLOBIN (HGB A1C): Hemoglobin A1C: 5.5 % (ref 4.0–5.6)

## 2022-06-07 NOTE — Assessment & Plan Note (Addendum)
A1c at goal of 5.5.  Need to work on healthy diet and regular exercise.

## 2022-06-07 NOTE — Assessment & Plan Note (Signed)
He has hearing aids now and is doing well with them overall.  We also discussed hearing protection and if he is going to be using his Szo etc.

## 2022-06-07 NOTE — Assessment & Plan Note (Signed)
Pressure looks great today.  Continue current regimen.

## 2022-06-07 NOTE — Progress Notes (Addendum)
Established Patient Office Visit  Subjective   Patient ID: MCCRAY ANGERMEIER, male    DOB: 1950-04-24  Age: 72 y.o. MRN: [id number]  Chief Complaint  Patient presents with   Hypertension   ifg    HPI  Hypertension- Pt denies chest pain, SOB, dizziness, or heart palpitations.  Taking meds as directed w/o problems.  Denies medication side effects.    Impaired fasting glucose-no increased thirst or urination. No symptoms consistent with hypoglycemia.  He also injured his right wrist he was clipping monkey grass using scissors and then he felt okay for about a day or so and then started getting pain in his right wrist and into his hand with some numbness and tingling.  He was seen in he waited and treated with a cock-up splint for carpal tunnel syndrome.  The pain is now gone but he still gets some occasional numbness and tingling in his thumb and middle finger.  He is no longer wearing the night splint.   ROS    Objective:     BP 130/82   Pulse 76   Ht '6\' 3"'$  (1.905 m)   Wt 185 lb (83.9 kg)   SpO2 98%   BMI 23.12 kg/m    Physical Exam Constitutional:      Appearance: He is well-developed.  HENT:     Head: Normocephalic and atraumatic.  Cardiovascular:     Rate and Rhythm: Normal rate and regular rhythm.     Heart sounds: Normal heart sounds.  Pulmonary:     Effort: Pulmonary effort is normal.     Breath sounds: Normal breath sounds.  Skin:    General: Skin is warm and dry.  Neurological:     Mental Status: He is alert and oriented to person, place, and time.  Psychiatric:        Behavior: Behavior normal.      Results for orders placed or performed in visit on 06/07/22  POCT glycosylated hemoglobin (Hb A1C)  Result Value Ref Range   Hemoglobin A1C 5.5 4.0 - 5.6 %   HbA1c POC (<> result, manual entry)     HbA1c, POC (prediabetic range)     HbA1c, POC (controlled diabetic range)        The 10-year ASCVD risk score (Arnett DK, et al., 2019) is: 19.2%     Assessment & Plan:   Problem List Items Addressed This Visit       Cardiovascular and Mediastinum   HYPERTENSION - Primary    Pressure looks great today.  Continue current regimen.      Relevant Orders   BASIC METABOLIC PANEL WITH GFR     Endocrine   Impaired fasting glucose    A1c at goal of 5.5.  Need to work on healthy diet and regular exercise.      Relevant Orders   POCT glycosylated hemoglobin (Hb A1C) (Completed)     Other   Wears hearing aid    He has hearing aids now and is doing well with them overall.  We also discussed hearing protection and if he is going to be using his Szo etc.      Other Visit Diagnoses     Positive colorectal cancer screening using Cologuard test       Relevant Orders   Ambulatory referral to Gastroenterology   Acute carpal tunnel syndrome of right wrist          He would like to do colonoscopy for colon cancer screening.  So new referral placed today to GI.  Carpal tunnel syndrome-he certainly could start to rewear the splint at night if he would like but it does sound like it has been gradually improving.  Return in about 6 months (around 12/08/2022) for Hypertension, and IFG. Beatrice Lecher, MD

## 2022-06-08 LAB — BASIC METABOLIC PANEL WITH GFR
BUN: 18 mg/dL (ref 7–25)
CO2: 32 mmol/L (ref 20–32)
Calcium: 10 mg/dL (ref 8.6–10.3)
Chloride: 105 mmol/L (ref 98–110)
Creat: 0.89 mg/dL (ref 0.70–1.28)
Glucose, Bld: 97 mg/dL (ref 65–99)
Potassium: 4.6 mmol/L (ref 3.5–5.3)
Sodium: 143 mmol/L (ref 135–146)
eGFR: 92 mL/min/{1.73_m2} (ref >=60)

## 2022-06-08 NOTE — Progress Notes (Signed)
Your lab work is within acceptable range and there are no concerning findings.   ?

## 2022-06-09 ENCOUNTER — Ambulatory Visit (INDEPENDENT_AMBULATORY_CARE_PROVIDER_SITE_OTHER): Payer: Medicare HMO | Admitting: Family Medicine

## 2022-06-09 DIAGNOSIS — Z0000 Encounter for general adult medical examination without abnormal findings: Secondary | ICD-10-CM

## 2022-06-09 NOTE — Patient Instructions (Addendum)
Angel Flores Maintenance Summary and Written Plan of Care  Angel Flores ,  Thank you for allowing me to perform your Medicare Annual Wellness Visit and for your ongoing commitment to your health.   Health Maintenance & Immunization History Health Maintenance  Topic Date Due   COVID-19 Vaccine (3 - 2023-24 season) 06/25/2022 (Originally 11/27/2021)   COLONOSCOPY (Pts 45-45yr Insurance coverage will need to be confirmed)  06/09/2023 (Originally 02/26/2018)   Zoster Vaccines- Shingrix (1 of 2) 09/07/2023 (Originally 11/16/2001)   Medicare Annual Wellness (AWV)  06/09/2023   DTaP/Tdap/Td (3 - Td or Tdap) 05/27/2028   Pneumonia Vaccine 72 Years old  Completed   INFLUENZA VACCINE  Completed   Hepatitis C Screening  Completed   HPV VACCINES  Aged Out   Fecal DNA (Cologuard)  Discontinued   Immunization History  Administered Date(s) Administered   Fluad Quad(high Dose 65+) 12/05/2018, 12/06/2019, 12/04/2020, 12/07/2021   Influenza Whole 12/27/2008   Influenza, Seasonal, Injecte, Preservative Fre 12/29/2012   Influenza,inj,Quad PF,6+ Mos 01/11/2016   Influenza-Unspecified 02/11/2018   Janssen (J&J) SARS-COV-2 Vaccination 06/09/2019, 03/04/2020   Pneumococcal Conjugate-13 12/09/2016   Pneumococcal Polysaccharide-23 12/06/2017   Td 05/30/2009   Tdap 05/28/2018   Zoster, Live 04/24/2015    These are the patient goals that we discussed:  Goals Addressed               This Visit's Progress     Patient Stated (pt-stated)        Patient stated that he would like to continue to maintain his current lifestyle.         This is a list of Health Maintenance Items that are overdue or due now: Colorectal cancer screening Shingrix vaccine   Orders/Referrals Placed Today: No orders of the defined types were placed in this encounter.  (Contact our referral department at [phone] you have not spoken with someone about your referral appointment within the  next 5 days)    Follow-up Plan Follow-up with MHali Marry MD as planned Schedule shingrix vaccine at the pharmacy.  Patient will update when he is ready for a colorectal cancer screening. Medicare wellness visit in one year.  Patient will access AVS on my chart.      Colonoscopy, Adult A colonoscopy is a procedure to look at the entire large intestine. This procedure is done using a long, thin, flexible tube that has a camera on the end. You may have a colonoscopy: As a part of normal colorectal screening. If you have certain symptoms, such as: A low number of red blood cells in your blood (anemia). Diarrhea that does not go away. Pain in your abdomen. Blood in your stool. A colonoscopy can help screen for and diagnose medical problems, including: An abnormal growth of cells or tissue (tumor). Abnormal growths within the lining of your intestine (polyps). Inflammation. Areas of bleeding. Tell your health care provider about: Any allergies you have. All medicines you are taking, including vitamins, herbs, eye drops, creams, and over-the-counter medicines. Any problems you or family members have had with anesthetic medicines. Any bleeding problems you have. Any surgeries you have had. Any medical conditions you have. Any problems you have had with having bowel movements. Whether you are pregnant or may be pregnant. What are the risks? Generally, this is a safe procedure. However, problems may occur, including: Bleeding. Damage to your intestine. Allergic reactions to medicines given during the procedure. Infection. This is rare. What happens before the procedure?  Eating and drinking restrictions Follow instructions from your health care provider about eating or drinking restrictions, which may include: A few days before the procedure: Follow a low-fiber diet. Avoid nuts, seeds, dried fruit, raw fruits, and vegetables. 1-3 days before the procedure: Eat only  gelatin dessert or ice pops. Drink only clear liquids, such as water, clear juice, clear broth or bouillon, black coffee or tea, or clear soft drinks or sports drinks. Avoid liquids that contain red or purple dye. The day of the procedure: Do not eat solid foods. You may continue to drink clear liquids until up to 2 hours before the procedure. Do not eat or drink anything starting 2 hours before the procedure, or within the time period that your health care provider recommends. Bowel prep If you were prescribed a bowel prep to take by mouth (orally) to clean out your colon: Take it as told by your health care provider. Starting the day before your procedure, you will need to drink a large amount of liquid medicine. The liquid will cause you to have many bowel movements of loose stool until your stool becomes almost clear or light green. If your skin or the opening between the buttocks (anus) gets irritated from diarrhea, you may relieve the irritation using: Wipes with medicine in them, such as adult wet wipes with aloe and vitamin E. A product to soothe skin, such as petroleum jelly. If you vomit while drinking the bowel prep: Take a break for up to 60 minutes. Begin the bowel prep again. Call your health care provider if you keep vomiting or you cannot take the bowel prep without vomiting. To clean out your colon, you may also be given: Laxative medicines. These help you have a bowel movement. Instructions for enema use. An enema is liquid medicine injected into your rectum. Medicines Ask your health care provider about: Changing or stopping your regular medicines or supplements. This is especially important if you are taking iron supplements, diabetes medicines, or blood thinners. Taking medicines such as aspirin and ibuprofen. These medicines can thin your blood. Do not take these medicines unless your health care provider tells you to take them. Taking over-the-counter medicines,  vitamins, herbs, and supplements. General instructions Ask your health care provider what steps will be taken to help prevent infection. These may include washing skin with a germ-killing soap. If you will be going home right after the procedure, plan to have a responsible adult: Take you home from the hospital or clinic. You will not be allowed to drive. Care for you for the time you are told. What happens during the procedure?  An IV will be inserted into one of your veins. You will be given a medicine to make you fall asleep (general anesthetic). You will lie on your side with your knees bent. A lubricant will be put on the tube. Then the tube will be: Inserted into your anus. Gently eased through all parts of your large intestine. Air will be sent into your colon to keep it open. This may cause some pressure or cramping. Images will be taken with the camera and will appear on a screen. A small tissue sample may be removed to be looked at under a microscope (biopsy). The tissue may be sent to a lab for testing if any signs of problems are found. If small polyps are found, they may be removed and checked for cancer cells. When the procedure is finished, the tube will be removed. The procedure may vary  among health care providers and hospitals. What happens after the procedure? Your blood pressure, heart rate, breathing rate, and blood oxygen level will be monitored until you leave the hospital or clinic. You may have a small amount of blood in your stool. You may pass gas and have mild cramping or bloating in your abdomen. This is caused by the air that was used to open your colon during the exam. If you were given a sedative during the procedure, it can affect you for several hours. Do not drive or operate machinery until your health care provider says that it is safe. It is up to you to get the results of your procedure. Ask your health care provider, or the department that is doing the  procedure, when your results will be ready. Summary A colonoscopy is a procedure to look at the entire large intestine. Follow instructions from your health care provider about eating and drinking before the procedure. If you were prescribed an oral bowel prep to clean out your colon, take it as told by your health care provider. During the colonoscopy, a flexible tube with a camera on its end is inserted into the anus and then passed into all parts of the large intestine. This information is not intended to replace advice given to you by your health care provider. Make sure you discuss any questions you have with your health care provider. Document Revised: 03/09/2021 Document Reviewed: 11/05/2020 Elsevier Patient Education  [place] Maintenance, Male Adopting a healthy lifestyle and getting preventive care are important in promoting health and wellness. Ask your health care provider about: The right schedule for you to have regular tests and exams. Things you can do on your own to prevent diseases and keep yourself healthy. What should I know about diet, weight, and exercise? Eat a healthy diet  Eat a diet that includes plenty of vegetables, fruits, low-fat dairy products, and lean protein. Do not eat a lot of foods that are high in solid fats, added sugars, or sodium. Maintain a healthy weight Body mass index (BMI) is a measurement that can be used to identify possible weight problems. It estimates body fat based on height and weight. Your health care provider can help determine your BMI and help you achieve or maintain a healthy weight. Get regular exercise Get regular exercise. This is one of the most important things you can do for your health. Most adults should: Exercise for at least 150 minutes each week. The exercise should increase your heart rate and make you sweat (moderate-intensity exercise). Do strengthening exercises at least twice a week. This is in  addition to the moderate-intensity exercise. Spend less time sitting. Even light physical activity can be beneficial. Watch cholesterol and blood lipids Have your blood tested for lipids and cholesterol at 71 years of age, then have this test every 5 years. You may need to have your cholesterol levels checked more often if: Your lipid or cholesterol levels are high. You are older than 71 years of age. You are at high risk for heart disease. What should I know about cancer screening? Many types of cancers can be detected early and may often be prevented. Depending on your health history and family history, you may need to have cancer screening at various ages. This may include screening for: Colorectal cancer. Prostate cancer. Skin cancer. Lung cancer. What should I know about heart disease, diabetes, and high blood pressure? Blood pressure and heart disease High blood pressure  causes heart disease and increases the risk of stroke. This is more likely to develop in people who have high blood pressure readings or are overweight. Talk with your health care provider about your target blood pressure readings. Have your blood pressure checked: Every 3-5 years if you are 14-80 years of age. Every year if you are 65 years old or older. If you are between the ages of 63 and 26 and are a current or former smoker, ask your health care provider if you should have a one-time screening for abdominal aortic aneurysm (AAA). Diabetes Have regular diabetes screenings. This checks your fasting blood sugar level. Have the screening done: Once every three years after age 52 if you are at a normal weight and have a low risk for diabetes. More often and at a younger age if you are overweight or have a high risk for diabetes. What should I know about preventing infection? Hepatitis B If you have a higher risk for hepatitis B, you should be screened for this virus. Talk with your health care provider to find out  if you are at risk for hepatitis B infection. Hepatitis C Blood testing is recommended for: Everyone born from [birth year] through 1964. Anyone with known risk factors for hepatitis C. Sexually transmitted infections (STIs) You should be screened each year for STIs, including gonorrhea and chlamydia, if: You are sexually active and are younger than 71 years of age. You are older than 71 years of age and your health care provider tells you that you are at risk for this type of infection. Your sexual activity has changed since you were last screened, and you are at increased risk for chlamydia or gonorrhea. Ask your health care provider if you are at risk. Ask your health care provider about whether you are at high risk for HIV. Your health care provider may recommend a prescription medicine to help prevent HIV infection. If you choose to take medicine to prevent HIV, you should first get tested for HIV. You should then be tested every 3 months for as long as you are taking the medicine. Follow these instructions at home: Alcohol use Do not drink alcohol if your health care provider tells you not to drink. If you drink alcohol: Limit how much you have to 0-2 drinks a day. Know how much alcohol is in your drink. In the [place] one drink equals one 12 oz bottle of beer (355 mL), one 5 oz glass of wine (148 mL), or one 1 oz glass of hard liquor (44 mL). Lifestyle Do not use any products that contain nicotine or tobacco. These products include cigarettes, chewing tobacco, and vaping devices, such as e-cigarettes. If you need help quitting, ask your health care provider. Do not use street drugs. Do not share needles. Ask your health care provider for help if you need support or information about quitting drugs. General instructions Schedule regular health, dental, and eye exams. Stay current with your vaccines. Tell your health care provider if: You often feel depressed. You have ever been abused or do  not feel safe at home. Summary Adopting a healthy lifestyle and getting preventive care are important in promoting health and wellness. Follow your health care provider's instructions about healthy diet, exercising, and getting tested or screened for diseases. Follow your health care provider's instructions on monitoring your cholesterol and blood pressure. This information is not intended to replace advice given to you by your health care provider. Make sure you discuss any questions  you have with your health care provider. Document Revised: 08/04/2020 Document Reviewed: 08/04/2020 Elsevier Patient Education  [place].
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
## 2022-06-21 ENCOUNTER — Other Ambulatory Visit: Payer: Self-pay | Admitting: Family Medicine

## 2022-06-21 DIAGNOSIS — I10 Essential (primary) hypertension: Secondary | ICD-10-CM

## 2022-07-26 DIAGNOSIS — H524 Presbyopia: Secondary | ICD-10-CM | POA: Diagnosis not present

## 2022-12-01 DIAGNOSIS — L570 Actinic keratosis: Secondary | ICD-10-CM | POA: Diagnosis not present

## 2022-12-01 DIAGNOSIS — L578 Other skin changes due to chronic exposure to nonionizing radiation: Secondary | ICD-10-CM | POA: Diagnosis not present

## 2022-12-01 DIAGNOSIS — L821 Other seborrheic keratosis: Secondary | ICD-10-CM | POA: Diagnosis not present

## 2022-12-01 DIAGNOSIS — Z85828 Personal history of other malignant neoplasm of skin: Secondary | ICD-10-CM | POA: Diagnosis not present

## 2022-12-01 DIAGNOSIS — D225 Melanocytic nevi of trunk: Secondary | ICD-10-CM | POA: Diagnosis not present

## 2022-12-08 ENCOUNTER — Encounter: Payer: Self-pay | Admitting: Family Medicine

## 2022-12-08 ENCOUNTER — Ambulatory Visit (INDEPENDENT_AMBULATORY_CARE_PROVIDER_SITE_OTHER): Payer: Medicare HMO | Admitting: Family Medicine

## 2022-12-08 VITALS — BP 124/78 | HR 74 | Ht 75.0 in | Wt 185.0 lb

## 2022-12-08 DIAGNOSIS — Z23 Encounter for immunization: Secondary | ICD-10-CM

## 2022-12-08 DIAGNOSIS — I10 Essential (primary) hypertension: Secondary | ICD-10-CM

## 2022-12-08 DIAGNOSIS — E785 Hyperlipidemia, unspecified: Secondary | ICD-10-CM | POA: Insufficient documentation

## 2022-12-08 DIAGNOSIS — R7301 Impaired fasting glucose: Secondary | ICD-10-CM

## 2022-12-08 DIAGNOSIS — Z125 Encounter for screening for malignant neoplasm of prostate: Secondary | ICD-10-CM | POA: Diagnosis not present

## 2022-12-08 LAB — POCT GLYCOSYLATED HEMOGLOBIN (HGB A1C): Hemoglobin A1C: 5.1 % (ref 4.0–5.6)

## 2022-12-08 NOTE — Assessment & Plan Note (Signed)
Well controlled. Continue current regimen. Follow up in  6 mo  

## 2022-12-08 NOTE — Progress Notes (Signed)
   Established Patient Office Visit  Subjective   Patient ID: Angel Flores, male    DOB: Oct 20, 1950  Age: 72 y.o. MRN: [id number]  Chief Complaint  Patient presents with   Hypertension   ifg    HPI  Hypertension- Pt denies chest pain, SOB, dizziness, or heart palpitations.  Taking meds as directed w/o problems.  Denies medication side effects.    Impaired fasting glucose-no increased thirst or urination. No symptoms consistent with hypoglycemia.     ROS    Objective:     BP 124/78   Pulse 74   Ht 6\' 3"  (1.905 m)   Wt 185 lb (83.9 kg)   SpO2 100%   BMI 23.12 kg/m    Physical Exam Vitals and nursing note reviewed.  Constitutional:      Appearance: Normal appearance.  HENT:     Head: Normocephalic and atraumatic.  Eyes:     Conjunctiva/sclera: Conjunctivae normal.  Cardiovascular:     Rate and Rhythm: Normal rate and regular rhythm.  Pulmonary:     Effort: Pulmonary effort is normal.     Breath sounds: Normal breath sounds.  Skin:    General: Skin is warm and dry.  Neurological:     Mental Status: He is alert.  Psychiatric:        Mood and Affect: Mood normal.      Results for orders placed or performed in visit on 12/08/22  POCT HgB A1C  Result Value Ref Range   Hemoglobin A1C 5.1 4.0 - 5.6 %   HbA1c POC (<> result, manual entry)     HbA1c, POC (prediabetic range)     HbA1c, POC (controlled diabetic range)        The 10-year ASCVD risk score (Arnett DK, et al., 2019) is: 19.1%    Assessment & Plan:   Problem List Items Addressed This Visit       Cardiovascular and Mediastinum   HYPERTENSION - Primary    Home BPs run around 130/80 at home.       Relevant Orders   CMP14+EGFR   Lipid panel   PSA     Endocrine   Impaired fasting glucose    Well controlled. Continue current regimen. Follow up in  74mo       Relevant Orders   POCT HgB A1C (Completed)   CMP14+EGFR   Lipid panel   PSA     Other   Hyperlipidemia LDL goal <100    Relevant Orders   CMP14+EGFR   Lipid panel   PSA   Other Visit Diagnoses     Encounter for immunization       Relevant Orders   Flu Vaccine Trivalent High Dose (Fluad) (Completed)   Screening for malignant neoplasm of prostate       Relevant Orders   CMP14+EGFR   Lipid panel   PSA       Return in about 6 months (around 06/07/2023) for BP/IFG.    Nani Gasser, MD

## 2022-12-08 NOTE — Assessment & Plan Note (Addendum)
Home BPs run around 130/80 at home.

## 2022-12-09 LAB — LIPID PANEL
Chol/HDL Ratio: 2.9 ratio (ref 0.0–5.0)
Cholesterol, Total: 171 mg/dL (ref 100–199)
HDL: 58 mg/dL (ref >39)
LDL Chol Calc (NIH): 100 mg/dL — ABNORMAL HIGH (ref 0–99)
Triglycerides: 68 mg/dL (ref 0–149)
VLDL Cholesterol Cal: 13 mg/dL (ref 5–40)

## 2022-12-09 LAB — CMP14+EGFR
ALT: 22 IU/L (ref 0–44)
AST: 30 IU/L (ref 0–40)
Albumin: 4.4 g/dL (ref 3.8–4.8)
Alkaline Phosphatase: 77 IU/L (ref 44–121)
BUN/Creatinine Ratio: 17 (ref 10–24)
BUN: 16 mg/dL (ref 8–27)
Bilirubin Total: 0.7 mg/dL (ref 0.0–1.2)
CO2: 25 mmol/L (ref 20–29)
Calcium: 9.7 mg/dL (ref 8.6–10.2)
Chloride: 101 mmol/L (ref 96–106)
Creatinine, Ser: 0.94 mg/dL (ref 0.76–1.27)
Globulin, Total: 2.4 g/dL (ref 1.5–4.5)
Glucose: 95 mg/dL (ref 70–99)
Potassium: 4.1 mmol/L (ref 3.5–5.2)
Sodium: 142 mmol/L (ref 134–144)
Total Protein: 6.8 g/dL (ref 6.0–8.5)
eGFR: 87 mL/min/{1.73_m2} (ref >59)

## 2022-12-09 LAB — PSA: Prostate Specific Ag, Serum: 1.1 ng/mL (ref 0.0–4.0)

## 2022-12-09 NOTE — Progress Notes (Signed)
HI Dan  your LDL was right at 100, close to goal.  Continue to work on regular exercise and healthy diet. Metabolic panel is normal.

## 2023-04-09 ENCOUNTER — Other Ambulatory Visit: Payer: Self-pay | Admitting: Family Medicine

## 2023-04-09 DIAGNOSIS — I10 Essential (primary) hypertension: Secondary | ICD-10-CM

## 2023-06-07 ENCOUNTER — Ambulatory Visit: Payer: Medicare HMO | Admitting: Family Medicine

## 2023-06-13 ENCOUNTER — Ambulatory Visit (INDEPENDENT_AMBULATORY_CARE_PROVIDER_SITE_OTHER): Payer: Medicare HMO

## 2023-06-13 VITALS — Ht 75.0 in | Wt 185.0 lb

## 2023-06-13 DIAGNOSIS — Z0000 Encounter for general adult medical examination without abnormal findings: Secondary | ICD-10-CM | POA: Diagnosis not present
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
## 2023-06-13 NOTE — Patient Instructions (Signed)
  Mr. Donaghey , Thank you for taking time to come for your Medicare Wellness Visit. I appreciate your ongoing commitment to your health goals. Please review the following plan we discussed and let me know if I can assist you in the future.   These are the goals we discussed:  Goals       Activity and Exercise Increased      He would like to stay active this year.       Patient Stated (pt-stated)      10/07/2020 AWV Goal: Exercise for General Health  Patient will verbalize understanding of the benefits of increased physical activity: Exercising regularly is important. It will improve your overall fitness, flexibility, and endurance. Regular exercise also will improve your overall health. It can help you control your weight, reduce stress, and improve your bone density. Over the next year, patient will increase physical activity as tolerated with a goal of at least 150 minutes of moderate physical activity per week.  You can tell that you are exercising at a moderate intensity if your heart starts beating faster and you start breathing faster but can still hold a conversation. Moderate-intensity exercise ideas include: Walking 1 mile (1.6 km) in about 15 minutes Biking Hiking Golfing Dancing Water aerobics Patient will verbalize understanding of everyday activities that increase physical activity by providing examples like the following: Yard work, such as: Insurance underwriter Gardening Washing windows or floors Patient will be able to explain general safety guidelines for exercising:  Before you start a new exercise program, talk with your health care provider. Do not exercise so much that you hurt yourself, feel dizzy, or get very short of breath. Wear comfortable clothes and wear shoes with good support. Drink plenty of water while you exercise to prevent dehydration or heat stroke. Work out until your  breathing and your heartbeat get faster.       Patient Stated (pt-stated)      Patient stated that he would like to continue to maintain his current lifestyle.        This is a list of the screening recommended for you and due dates:  Health Maintenance  Topic Date Due   Colon Cancer Screening  02/26/2018   COVID-19 Vaccine (3 - 2024-25 season) 11/28/2022   Zoster (Shingles) Vaccine (1 of 2) 09/07/2023*   Medicare Annual Wellness Visit  06/12/2024   DTaP/Tdap/Td vaccine (3 - Td or Tdap) 05/27/2028   Pneumonia Vaccine  Completed   Flu Shot  Completed   Hepatitis C Screening  Completed   HPV Vaccine  Aged Out   Cologuard (Stool DNA test)  Discontinued  *Topic was postponed. The date shown is not the original due date.

## 2023-06-20 ENCOUNTER — Ambulatory Visit: Payer: Medicare HMO | Admitting: Family Medicine

## 2023-06-28 ENCOUNTER — Ambulatory Visit (INDEPENDENT_AMBULATORY_CARE_PROVIDER_SITE_OTHER): Payer: Medicare HMO | Admitting: Family Medicine

## 2023-06-28 ENCOUNTER — Encounter: Payer: Self-pay | Admitting: Family Medicine

## 2023-06-28 VITALS — BP 151/80 | HR 61 | Temp 98.5°F | Ht 75.0 in | Wt 180.1 lb

## 2023-06-28 DIAGNOSIS — R7301 Impaired fasting glucose: Secondary | ICD-10-CM

## 2023-06-28 DIAGNOSIS — I10 Essential (primary) hypertension: Secondary | ICD-10-CM | POA: Diagnosis not present

## 2023-06-28 MED ORDER — AMLODIPINE BESYLATE 2.5 MG PO TABS: 2.5000 mg | ORAL_TABLET | Freq: Every day | ORAL | 0 refills | Status: DC

## 2023-06-28 NOTE — Progress Notes (Addendum)
   Established Patient Office Visit  Subjective  Patient ID: Angel Flores, male    DOB: March 22, 1951  Age: 73 y.o. MRN: [id number]  Chief Complaint  Patient presents with   Medical Management of Chronic Issues    HPI  Hypertension- Pt denies chest pain, SOB, dizziness, or heart palpitations.  Taking meds as directed w/o problems.  Denies medication side effects.  Getting 130s mostly at home.    Impaired fasting glucose-no increased thirst or urination. No symptoms consistent with hypoglycemia.  He had started taking his allergy medications for spring he restarted Flonase and it did not seem to be helping so he switched to Nasonex and he feels like that actually has been working much better for him.  He is got so congested and his ears were so full that he actually had delay his hearing test.   ROS    Objective:     BP (!) 151/80 (BP Location: Left Arm, Patient Position: Sitting, Cuff Size: Normal)   Pulse 61   Temp 98.5 F (36.9 C) (Oral)   Ht 6\' 3"  (1.905 m)   Wt 180 lb 1.9 oz (81.7 kg)   SpO2 97%   BMI 22.51 kg/m    Physical Exam Vitals and nursing note reviewed.  Constitutional:      Appearance: Normal appearance.  HENT:     Head: Normocephalic and atraumatic.  Eyes:     Conjunctiva/sclera: Conjunctivae normal.  Cardiovascular:     Rate and Rhythm: Normal rate and regular rhythm.  Pulmonary:     Effort: Pulmonary effort is normal.     Breath sounds: Normal breath sounds.  Skin:    General: Skin is warm and dry.  Neurological:     Mental Status: He is alert.  Psychiatric:        Mood and Affect: Mood normal.      No results found for any visits on 06/28/23.    The 10-year ASCVD risk score (Arnett DK, et al., 2019) is: 25.4%    Assessment & Plan:   Problem List Items Addressed This Visit       Cardiovascular and Mediastinum   HYPERTENSION - Primary   Blood pressure still elevated here today.  He is on the highest dose of Diovan HCT tolerating  it well.  Will add a low-dose of amlodipine.  He still getting blood pressures in the 130s at home and we really need to get that into the 120s.  Follow-up in about 1 month for nurse visit for repeat blood pressure check will get updated labs today.      Relevant Medications   amLODipine (NORVASC) 2.5 MG tablet   Other Relevant Orders   CMP14+EGFR   Hemoglobin A1c     Endocrine   Impaired fasting glucose   Due for A1C.  Plan to check today.        Relevant Orders   CMP14+EGFR   Hemoglobin A1c    Seasonal allergy symptoms have been better controlled and he has rescheduled his hearing testing.  Return in about 4 months (around 10/28/2023) for Hypertension, Pre-diabetes.    Nani Gasser, MD

## 2023-06-28 NOTE — Assessment & Plan Note (Signed)
 Due for A1C.  Plan to check today.

## 2023-06-28 NOTE — Assessment & Plan Note (Signed)
 Blood pressure still elevated here today.  He is on the highest dose of Diovan HCT tolerating it well.  Will add a low-dose of amlodipine.  He still getting blood pressures in the 130s at home and we really need to get that into the 120s.  Follow-up in about 1 month for nurse visit for repeat blood pressure check will get updated labs today.

## 2023-06-29 ENCOUNTER — Encounter: Payer: Self-pay | Admitting: Family Medicine

## 2023-06-29 LAB — CMP14+EGFR
ALT: 22 IU/L (ref 0–44)
AST: 29 IU/L (ref 0–40)
Albumin: 4.2 g/dL (ref 3.8–4.8)
Alkaline Phosphatase: 84 IU/L (ref 44–121)
BUN/Creatinine Ratio: 14 (ref 10–24)
BUN: 12 mg/dL (ref 8–27)
Bilirubin Total: 0.6 mg/dL (ref 0.0–1.2)
CO2: 23 mmol/L (ref 20–29)
Calcium: 9.5 mg/dL (ref 8.6–10.2)
Chloride: 104 mmol/L (ref 96–106)
Creatinine, Ser: 0.87 mg/dL (ref 0.76–1.27)
Globulin, Total: 2.2 g/dL (ref 1.5–4.5)
Glucose: 91 mg/dL (ref 70–99)
Potassium: 4 mmol/L (ref 3.5–5.2)
Sodium: 142 mmol/L (ref 134–144)
Total Protein: 6.4 g/dL (ref 6.0–8.5)
eGFR: 92 mL/min/{1.73_m2} (ref >59)

## 2023-06-29 LAB — HEMOGLOBIN A1C
Est. average glucose Bld gHb Est-mCnc: 100 mg/dL
Hgb A1c MFr Bld: 5.1 % (ref 4.8–5.6)

## 2023-06-29 NOTE — Progress Notes (Signed)
 Your lab work is within acceptable range and there are no concerning findings.   ?

## 2023-08-02 DIAGNOSIS — H43393 Other vitreous opacities, bilateral: Secondary | ICD-10-CM | POA: Diagnosis not present

## 2023-08-02 DIAGNOSIS — H25813 Combined forms of age-related cataract, bilateral: Secondary | ICD-10-CM | POA: Diagnosis not present

## 2023-08-02 DIAGNOSIS — H524 Presbyopia: Secondary | ICD-10-CM | POA: Diagnosis not present

## 2023-08-02 DIAGNOSIS — D3131 Benign neoplasm of right choroid: Secondary | ICD-10-CM | POA: Diagnosis not present

## 2023-09-12 ENCOUNTER — Other Ambulatory Visit: Payer: Self-pay | Admitting: Family Medicine

## 2023-09-12 DIAGNOSIS — I10 Essential (primary) hypertension: Secondary | ICD-10-CM

## 2023-10-31 ENCOUNTER — Encounter: Payer: Self-pay | Admitting: Family Medicine

## 2023-10-31 ENCOUNTER — Ambulatory Visit (INDEPENDENT_AMBULATORY_CARE_PROVIDER_SITE_OTHER): Admitting: Family Medicine

## 2023-10-31 VITALS — BP 124/84 | HR 67 | Ht 75.0 in | Wt 182.0 lb

## 2023-10-31 DIAGNOSIS — E785 Hyperlipidemia, unspecified: Secondary | ICD-10-CM | POA: Diagnosis not present

## 2023-10-31 DIAGNOSIS — R7301 Impaired fasting glucose: Secondary | ICD-10-CM | POA: Diagnosis not present

## 2023-10-31 DIAGNOSIS — I10 Essential (primary) hypertension: Secondary | ICD-10-CM

## 2023-10-31 LAB — POCT GLYCOSYLATED HEMOGLOBIN (HGB A1C): Hemoglobin A1C: 5.3 % (ref 4.0–5.6)

## 2023-10-31 NOTE — Assessment & Plan Note (Addendum)
 Looks great today.  A1c of 5.3.  Continue current regimen continue to monitor periodically but it has really looked fantastic the last several times.  Lab Results  Component Value Date   HGBA1C 5.3 10/31/2023

## 2023-10-31 NOTE — Assessment & Plan Note (Signed)
 For repeat and updated lipid labs.

## 2023-10-31 NOTE — Assessment & Plan Note (Signed)
 Well controlled. Continue current regimen. Follow up in  6 mo

## 2023-10-31 NOTE — Progress Notes (Signed)
   Established Patient Office Visit  Subjective  Patient ID: Angel Flores, male    DOB: 09-07-1950  Age: 73 y.o. MRN: [id number]  Chief Complaint  Patient presents with   Hypertension    Pt reports that his home BP's are running in the mid 120's to 130's   ifg    HPI   Hypertension- Pt denies chest pain, SOB, dizziness, or heart palpitations.  Taking meds as directed w/o problems.  Denies medication side effects.  Home blood pressures have been well-controlled running in the 120s to 130s.  We had added a low-dose of amlodipine  when he was last year.  He states he is tolerating it well without any side effects or problems.  Impaired fasting glucose-no increased thirst or urination. No symptoms consistent with hypoglycemia.    ROS    Objective:     BP 124/84   Pulse 67   Ht 6' 3 (1.905 m)   Wt 182 lb (82.6 kg)   SpO2 98%   BMI 22.75 kg/m    Physical Exam Vitals and nursing note reviewed.  Constitutional:      Appearance: Normal appearance.  HENT:     Head: Normocephalic and atraumatic.  Eyes:     Conjunctiva/sclera: Conjunctivae normal.  Cardiovascular:     Rate and Rhythm: Normal rate and regular rhythm.  Pulmonary:     Effort: Pulmonary effort is normal.     Breath sounds: Normal breath sounds.  Skin:    General: Skin is warm and dry.  Neurological:     Mental Status: He is alert.  Psychiatric:        Mood and Affect: Mood normal.      Results for orders placed or performed in visit on 10/31/23  POCT HgB A1C  Result Value Ref Range   Hemoglobin A1C 5.3 4.0 - 5.6 %   HbA1c POC (<> result, manual entry)     HbA1c, POC (prediabetic range)     HbA1c, POC (controlled diabetic range)        The 10-year ASCVD risk score (Arnett DK, et al., 2019) is: 18.6%    Assessment & Plan:   Problem List Items Addressed This Visit       Cardiovascular and Mediastinum   HYPERTENSION - Primary   Well controlled. Continue current regimen. Follow up in  6 mo        Relevant Orders   Lipid panel   CMP14+EGFR     Endocrine   Impaired fasting glucose   Looks great today.  A1c of 5.3.  Continue current regimen continue to monitor periodically but it has really looked fantastic the last several times.  Lab Results  Component Value Date   HGBA1C 5.3 10/31/2023         Relevant Orders   POCT HgB A1C (Completed)   Lipid panel   CMP14+EGFR     Other   Hyperlipidemia LDL goal <100   For repeat and updated lipid labs.       Return in about 6 months (around 05/02/2024) for Hypertension.    Dorothyann Byars, MD

## 2023-11-01 ENCOUNTER — Ambulatory Visit: Payer: Self-pay | Admitting: Family Medicine

## 2023-11-01 LAB — CMP14+EGFR
ALT: 22 IU/L (ref 0–44)
AST: 30 IU/L (ref 0–40)
Albumin: 4.4 g/dL (ref 3.8–4.8)
Alkaline Phosphatase: 89 IU/L (ref 44–121)
BUN/Creatinine Ratio: 15 (ref 10–24)
BUN: 14 mg/dL (ref 8–27)
Bilirubin Total: 0.8 mg/dL (ref 0.0–1.2)
CO2: 22 mmol/L (ref 20–29)
Calcium: 9.7 mg/dL (ref 8.6–10.2)
Chloride: 103 mmol/L (ref 96–106)
Creatinine, Ser: 0.92 mg/dL (ref 0.76–1.27)
Globulin, Total: 2.1 g/dL (ref 1.5–4.5)
Glucose: 90 mg/dL (ref 70–99)
Potassium: 4 mmol/L (ref 3.5–5.2)
Sodium: 140 mmol/L (ref 134–144)
Total Protein: 6.5 g/dL (ref 6.0–8.5)
eGFR: 89 mL/min/1.73 (ref >59)

## 2023-11-01 LAB — LIPID PANEL
Chol/HDL Ratio: 3 ratio (ref 0.0–5.0)
Cholesterol, Total: 176 mg/dL (ref 100–199)
HDL: 59 mg/dL (ref >39)
LDL Chol Calc (NIH): 103 mg/dL — ABNORMAL HIGH (ref 0–99)
Triglycerides: 74 mg/dL (ref 0–149)
VLDL Cholesterol Cal: 14 mg/dL (ref 5–40)

## 2023-11-01 NOTE — Progress Notes (Signed)
 Hi Dan, LDL just borderline continue to work on healthy diet and regular exercise.  Metabolic panel looks great.  Please call [facility] [place] to see if he went there for his last colonoscopy in 2020.  He cannot remember the exact location and we do not have a record of it.

## 2023-11-29 ENCOUNTER — Encounter: Payer: Self-pay | Admitting: Sports Medicine

## 2023-12-07 DIAGNOSIS — D1801 Hemangioma of skin and subcutaneous tissue: Secondary | ICD-10-CM | POA: Diagnosis not present

## 2023-12-07 DIAGNOSIS — L570 Actinic keratosis: Secondary | ICD-10-CM | POA: Diagnosis not present

## 2023-12-07 DIAGNOSIS — Z85828 Personal history of other malignant neoplasm of skin: Secondary | ICD-10-CM | POA: Diagnosis not present

## 2023-12-07 DIAGNOSIS — L821 Other seborrheic keratosis: Secondary | ICD-10-CM | POA: Diagnosis not present

## 2023-12-07 DIAGNOSIS — D225 Melanocytic nevi of trunk: Secondary | ICD-10-CM | POA: Diagnosis not present

## 2023-12-07 DIAGNOSIS — L578 Other skin changes due to chronic exposure to nonionizing radiation: Secondary | ICD-10-CM | POA: Diagnosis not present

## 2023-12-07 DIAGNOSIS — Z08 Encounter for follow-up examination after completed treatment for malignant neoplasm: Secondary | ICD-10-CM | POA: Diagnosis not present

## 2024-03-09 ENCOUNTER — Telehealth: Payer: Self-pay

## 2024-03-09 DIAGNOSIS — I10 Essential (primary) hypertension: Secondary | ICD-10-CM

## 2024-03-09 NOTE — Progress Notes (Signed)
° °  03/09/2024  Patient ID: Angel Flores, male   DOB: 1950-06-15, 73 y.o.   MRN: [id number]  This patient is appearing on a report for being at risk of failing the adherence measure for hypertension (ACEi/ARB) medications this calendar year.   Medication: valsartan /hydrochlorothiazide  320/25mg  Last fill date: 09/04/23 for 90 day supply  Contact pharmacy to refill, and they state patient informed them in June that he no longer takes this medication; so refills were canceled.  Contacted patient, and he did not understand amlodipine  2.5mg  daily was to be taken in addition to valsartan /hydrochlorothiazide  320/25mg ; so he actually has not taken this medication since April.  Patient has seen PCP since then in August, and BP was 124/84- this had been 151/80 at 4/1 visit before he stopped valsartan /hydrochlorothiazide  and started amlodipine  2.5mg  daily.  Patient does monitor home BP regularly and states this stays between 125/78 and 133/85.  Does not endorse any swelling in feet and ankles.  I recommend he continue current regimen at this time and continue to monitor home BP daily.  He sees PCP again in February, so I asked him to notify us  if BP were to creep up over 130/80 consistently.  If this occurred, amlodipine  could be increased to 5mg  versus adding another medication on as long as no edema present.  Angel Flores, PharmD, DPLA

## 2024-05-02 NOTE — Progress Notes (Unsigned)
" ° °  Established Patient Office Visit  Patient ID: Angel Flores, male    DOB: 1950/09/19  Age: 74 y.o. MRN: [id number] PCP: Alvan Dorothyann JONETTA, MD  No chief complaint on file.   Subjective:     HPI  Discussed the use of AI scribe software for clinical note transcription with the patient, who gave verbal consent to proceed.  History of Present Illness    {History (Optional):23778}  ROS    Objective:     There were no vitals taken for this visit. {Vitals History (Optional):23777}  Physical Exam  {PhysExam Abridge (Optional):210964309} No results found for any visits on 05/03/24.  {Labs (Optional):23779}  The 10-year ASCVD risk score (Arnett DK, et al., 2019) is: 20.1%    Assessment & Plan:   Problem List Items Addressed This Visit       Cardiovascular and Mediastinum   HYPERTENSION - Primary     Endocrine   Impaired fasting glucose    Assessment and Plan Assessment & Plan     No follow-ups on file.    Dorothyann Alvan, MD [facility] at [facility]   "

## 2024-05-03 ENCOUNTER — Encounter: Payer: Self-pay | Admitting: Family Medicine

## 2024-05-03 ENCOUNTER — Ambulatory Visit: Admitting: Family Medicine

## 2024-05-03 VITALS — BP 132/64 | HR 75 | Ht 75.0 in | Wt 182.8 lb

## 2024-05-03 DIAGNOSIS — I10 Essential (primary) hypertension: Secondary | ICD-10-CM

## 2024-05-03 DIAGNOSIS — R7301 Impaired fasting glucose: Secondary | ICD-10-CM | POA: Diagnosis not present

## 2024-05-03 DIAGNOSIS — Z23 Encounter for immunization: Secondary | ICD-10-CM

## 2024-05-03 LAB — POCT GLYCOSYLATED HEMOGLOBIN (HGB A1C): Hemoglobin A1C: 5.4 % (ref 4.0–5.6)

## 2024-05-03 MED ORDER — VALSARTAN 80 MG PO TABS: 80.0000 mg | ORAL_TABLET | Freq: Every day | ORAL | 0 refills | Status: AC

## 2024-05-03 NOTE — Assessment & Plan Note (Signed)
 Lab Results  Component Value Date   HGBA1C 5.4 05/03/2024   Well controlled.

## 2024-05-03 NOTE — Assessment & Plan Note (Signed)
 Essential hypertension Home blood pressure satisfactory at 120s/80s. Office readings higher; possible causes include white coat syndrome or home cuff inaccuracy. Current amlodipine  2.5 mg. Valsartan  not taken with amlodipine . Possible home cuff inaccuracy. - Rechecked blood pressure at visit end. - Bring home cuff next visit for comparison. - valsartan  80mg  addition to amlodipine 

## 2024-05-04 ENCOUNTER — Ambulatory Visit: Payer: Self-pay | Admitting: Family Medicine

## 2024-05-04 LAB — CMP14+EGFR
ALT: 22 [IU]/L (ref 0–44)
AST: 31 [IU]/L (ref 0–40)
Albumin: 4.4 g/dL (ref 3.8–4.8)
Alkaline Phosphatase: 86 [IU]/L (ref 47–123)
BUN/Creatinine Ratio: 10 (ref 10–24)
BUN: 10 mg/dL (ref 8–27)
Bilirubin Total: 0.7 mg/dL (ref 0.0–1.2)
CO2: 23 mmol/L (ref 20–29)
Calcium: 9.6 mg/dL (ref 8.6–10.2)
Chloride: 103 mmol/L (ref 96–106)
Creatinine, Ser: 0.98 mg/dL (ref 0.76–1.27)
Globulin, Total: 2.4 g/dL (ref 1.5–4.5)
Glucose: 89 mg/dL (ref 70–99)
Potassium: 4.2 mmol/L (ref 3.5–5.2)
Sodium: 142 mmol/L (ref 134–144)
Total Protein: 6.8 g/dL (ref 6.0–8.5)
eGFR: 82 mL/min/{1.73_m2}

## 2024-05-04 NOTE — Progress Notes (Signed)
 Your lab work is within acceptable range and there are no concerning findings.   ?

## 2024-05-17 ENCOUNTER — Ambulatory Visit

## 2024-06-13 ENCOUNTER — Ambulatory Visit
